# Patient Record
Sex: Female | Born: 1995 | Race: Black or African American | Hispanic: No | Marital: Single | Smoking: Never smoker
Health system: Southern US, Community
[De-identification: ages and names within clinical notes are randomized; demographics above are authoritative.]

## PROBLEM LIST (undated history)

## (undated) ENCOUNTER — Inpatient Hospital Stay (HOSPITAL_COMMUNITY): Payer: Self-pay

## (undated) DIAGNOSIS — Z789 Other specified health status: Secondary | ICD-10-CM

## (undated) HISTORY — PX: NO PAST SURGERIES: SHX2092

---

## 2016-05-28 ENCOUNTER — Inpatient Hospital Stay (HOSPITAL_COMMUNITY)
Admission: AD | Admit: 2016-05-28 | Discharge: 2016-05-28 | Disposition: A | Discharge status: Home / Self Care | Payer: Self-pay | Source: Ambulatory Visit | Attending: Obstetrics & Gynecology | Admitting: Obstetrics & Gynecology

## 2016-05-28 ENCOUNTER — Encounter (HOSPITAL_COMMUNITY): Payer: Self-pay | Admitting: *Deleted

## 2016-05-28 ENCOUNTER — Inpatient Hospital Stay (HOSPITAL_COMMUNITY): Payer: Self-pay

## 2016-05-28 DIAGNOSIS — O26899 Other specified pregnancy related conditions, unspecified trimester: Secondary | ICD-10-CM

## 2016-05-28 DIAGNOSIS — O26891 Other specified pregnancy related conditions, first trimester: Secondary | ICD-10-CM | POA: Insufficient documentation

## 2016-05-28 DIAGNOSIS — Z3A01 Less than 8 weeks gestation of pregnancy: Secondary | ICD-10-CM | POA: Insufficient documentation

## 2016-05-28 DIAGNOSIS — R109 Unspecified abdominal pain: Secondary | ICD-10-CM | POA: Insufficient documentation

## 2016-05-28 DIAGNOSIS — O9989 Other specified diseases and conditions complicating pregnancy, childbirth and the puerperium: Secondary | ICD-10-CM

## 2016-05-28 HISTORY — DX: Other specified health status: Z78.9

## 2016-05-28 LAB — URINALYSIS, ROUTINE W REFLEX MICROSCOPIC
Bilirubin Urine: NEGATIVE
GLUCOSE, UA: NEGATIVE mg/dL
Hgb urine dipstick: NEGATIVE
LEUKOCYTES UA: NEGATIVE
NITRITE: NEGATIVE
PH: 6 (ref 5.0–8.0)
Protein, ur: NEGATIVE mg/dL
Specific Gravity, Urine: 1.025 (ref 1.005–1.030)

## 2016-05-28 LAB — HCG, QUANTITATIVE, PREGNANCY: hCG, Beta Chain, Quant, S: 73580 m[IU]/mL — ABNORMAL HIGH (ref <5)

## 2016-05-28 LAB — POCT PREGNANCY, URINE: PREG TEST UR: POSITIVE — AB

## 2016-05-28 NOTE — MAU Provider Note (Signed)
History     CSN: [id number]  Arrival date and time: 05/28/16 1605   First Provider Initiated Contact with Patient 05/28/16 1707      Chief Complaint  Patient presents with  . Abdominal Pain   Laura Moyer is a 20 y.o. G1P0 at [redacted]w[redacted]d by LMP who presents to MAU today for evaluation of abdominal/pelvic pain in early pregnancy. Pain started two days ago, is constant, has gotten progressively worse (see details below). Denies any vaginal bleeding, abnormal vaginal discharge, fevers, chills, sweats, dysuria, nausea, vomiting, other GI or GU symptoms or other general symptoms.    Pelvic Pain  The patient's primary symptoms include pelvic pain. This is a new problem. The current episode started yesterday. The problem occurs constantly. The problem has been gradually worsening. The pain is moderate. The problem affects both (But left>right) sides. She is pregnant. Associated symptoms include abdominal pain. Pertinent negatives include no chills, constipation, diarrhea, dysuria, fever, flank pain, frequency, hematuria, nausea, urgency or vomiting. The symptoms are aggravated by activity. She has tried nothing for the symptoms. She is sexually active. No, her partner does not have an STD. She uses nothing for contraception.   Obstetric History   G1   P0   T0   P0   A0   L0    SAB0   TAB0   Ectopic0   Multiple0   Live Births0     # Outcome Date GA Lbr Len/2nd Weight Sex Delivery Anes PTL Lv  1 Current               Past Medical History:  Diagnosis Date  . Medical history non-contributory     Past Surgical History:  Procedure Laterality Date  . NO PAST SURGERIES     No family history on file.  Social History  Substance Use Topics  . Smoking status: Never Smoker  . Smokeless tobacco: Never Used  . Alcohol use No    Allergies: No Known Allergies  Prescriptions Prior to Admission  Medication Sig Dispense Refill Last Dose  . Prenatal Vit-Fe Fumarate-FA (PRENATAL MULTIVITAMIN) TABS  tablet Take 1 tablet by mouth daily at 12 noon.   05/27/2016 at Unknown time    Review of Systems  Constitutional: Negative for chills, fever, malaise/fatigue and weight loss.  Gastrointestinal: Positive for abdominal pain. Negative for blood in stool, constipation, diarrhea, nausea and vomiting.  Genitourinary: Positive for pelvic pain. Negative for dysuria, flank pain, frequency, hematuria and urgency.       No vaginal bleeding  Musculoskeletal: Negative for myalgias.   Physical Exam   Blood pressure 115/66, pulse 86, temperature 98.2 F (36.8 C), temperature source Oral, resp. rate 16, height 5\' 2"  (1.575 m), weight 121 lb (54.9 kg), last menstrual period 04/11/2016.  Physical Exam  Constitutional: She is oriented to person, place, and time. She appears well-developed and well-nourished.  HENT:  Head: Normocephalic and atraumatic.  Eyes: EOM are normal. Pupils are equal, round, and reactive to light.  Neck: Normal range of motion. Neck supple.  Cardiovascular: Normal rate.   Respiratory: Effort normal and breath sounds normal.  GI: Soft. She exhibits no distension and no mass. There is tenderness. There is no rebound and no guarding.  Mild diffuse lower abdominal tenderness to palpation, LLQ>RLQ  Genitourinary:  Genitourinary Comments: Deferred  Musculoskeletal: Normal range of motion.  Neurological: She is alert and oriented to person, place, and time.  Skin: Skin is warm and dry.  Psychiatric: She has a normal mood and  affect. Her behavior is normal.    MAU Course  Procedures  MDM Labs and Pelvic ultrasound ordered  Results for orders placed or performed during the hospital encounter of 05/28/16 (from the past 24 hour(s))  Urinalysis, Routine w reflex microscopic (not at [facility])     Status: Abnormal   Collection Time: 05/28/16  4:25 PM  Result Value Ref Range   Color, Urine YELLOW YELLOW   APPearance HAZY (A) CLEAR   Specific Gravity, Urine 1.025 1.005 - 1.030   pH 6.0  5.0 - 8.0   Glucose, UA NEGATIVE NEGATIVE mg/dL   Hgb urine dipstick NEGATIVE NEGATIVE   Bilirubin Urine NEGATIVE NEGATIVE   Ketones, ur >80 (A) NEGATIVE mg/dL   Protein, ur NEGATIVE NEGATIVE mg/dL   Nitrite NEGATIVE NEGATIVE   Leukocytes, UA NEGATIVE NEGATIVE  Pregnancy, urine POC     Status: Abnormal   Collection Time: 05/28/16  4:34 PM  Result Value Ref Range   Preg Test, Ur POSITIVE (A) NEGATIVE  hCG, quantitative, pregnancy     Status: Abnormal   Collection Time: 05/28/16  5:06 PM  Result Value Ref Range   hCG, Beta Chain, Quant, S 73,580 (H) <5 mIU/mL   US Ob Comp Less 14 Wks  Result Date: 05/28/2016 CLINICAL DATA:  Subacute onset of pelvic cramping. Initial encounter. EXAM: OBSTETRIC <14 WK [place] AND TRANSVAGINAL OB US TECHNIQUE: Both transabdominal and transvaginal ultrasound examinations were performed for complete evaluation of the gestation as well as the maternal uterus, adnexal regions, and pelvic cul-de-sac. Transvaginal technique was performed to assess early pregnancy. COMPARISON:  None. FINDINGS: Intrauterine gestational sac: Single; visualized and normal in shape. Yolk sac:  Yes Embryo:  Yes Cardiac Activity: Yes Heart Rate: 101  bpm CRL:  4  mm   6 w   0 d                  [place] EDC: 01/21/2017 Subchorionic hemorrhage: A trace amount of subchorionic hemorrhage is noted inferior to the gestational sac. Maternal uterus/adnexae: The uterus is otherwise unremarkable. The ovaries are within normal limits. The right ovary measures 2.5 x 1.6 x 1.6 cm, while the left ovary measures 3.2 x 2.3 x 2.3 cm. No suspicious adnexal masses are seen; there is no evidence for ovarian torsion. No free fluid is seen within the pelvic cul-de-sac. IMPRESSION: 1. Single live intrauterine pregnancy noted, with a crown-rump length of 4 mm, corresponding to a gestational age of [redacted] weeks 0 days. This matches the gestational age of [redacted] weeks 5 days by LMP, reflecting an estimated date of delivery of Jan 16, 2017. 2.  Trace subchorionic hemorrhage noted. Electronically Signed   By: Roanna Raider M.D.   On: 05/28/2016 19:58   US Ob Transvaginal  Result Date: 05/28/2016 CLINICAL DATA:  Subacute onset of pelvic cramping. Initial encounter. EXAM: OBSTETRIC <14 WK [place] AND TRANSVAGINAL OB US TECHNIQUE: Both transabdominal and transvaginal ultrasound examinations were performed for complete evaluation of the gestation as well as the maternal uterus, adnexal regions, and pelvic cul-de-sac. Transvaginal technique was performed to assess early pregnancy. COMPARISON:  None. FINDINGS: Intrauterine gestational sac: Single; visualized and normal in shape. Yolk sac:  Yes Embryo:  Yes Cardiac Activity: Yes Heart Rate: 101  bpm CRL:  4  mm   6 w   0 d                  [place] EDC: 01/21/2017 Subchorionic hemorrhage: A trace amount of subchorionic hemorrhage is  noted inferior to the gestational sac. Maternal uterus/adnexae: The uterus is otherwise unremarkable. The ovaries are within normal limits. The right ovary measures 2.5 x 1.6 x 1.6 cm, while the left ovary measures 3.2 x 2.3 x 2.3 cm. No suspicious adnexal masses are seen; there is no evidence for ovarian torsion. No free fluid is seen within the pelvic cul-de-sac. IMPRESSION: 1. Single live intrauterine pregnancy noted, with a crown-rump length of 4 mm, corresponding to a gestational age of [redacted] weeks 0 days. This matches the gestational age of [redacted] weeks 5 days by LMP, reflecting an estimated date of delivery of Jan 16, 2017. 2. Trace subchorionic hemorrhage noted. Electronically Signed   By: Roanna RaiderJeffery  Chang M.D.   On: 05/28/2016 19:58    Assessment and Plan  Pain in early pregnancy: Ultrasound results showing early viable IUP discussed with patient.  Patient advised to start prenatal care soon, will continue prenatal vitamins.  Pregnancy verification letter given to patient.  List of OB providers given to patient.  Bleeding and pain precautions reviewed. Advised to return for worsening  symptoms. Discharged to home in stable condition   Daimon Kean A, MD 05/28/2016, 6:56 PM

## 2016-05-28 NOTE — MAU Note (Signed)
Pt C/O lower abd cramping for the last 3 weeks, pos HPT last Thursday. [facility]  Denies bleeding.

## 2016-05-28 NOTE — Discharge Instructions (Signed)
[facility]    [facility]       Phone: [phone]  [facility] [place] Phone: [phone]  [facility] [place]  Phone: [phone]  [facility] [place]  Phone: [phone]  [facility] [place]  Phone: [phone]  [facility]       Phone: [phone]  [facility]    Phone: [phone]   [facility]    Phone: [phone]  [facility]    Phone: [phone]  [facility]    Phone: [phone]  [facility]    Phone: [phone]  [facility]       Phone: [phone]   [facility]  Phone: [phone]  [facility]    Phone: [phone]  [facility]   Phone: [phone]  [facility]      Phone: [phone]  [facility]    Phone: [phone]     Abdominal Pain During Pregnancy Abdominal pain is common in pregnancy. Most of the time, it does not cause harm. There are many causes of abdominal pain. Some causes are more serious than others. Some of the causes of abdominal pain in pregnancy are easily diagnosed. Occasionally, the diagnosis takes time to understand. Other times, the cause is not determined. Abdominal pain can be a sign that something is very wrong with the pregnancy, or the pain may have nothing to do with the pregnancy at all. For this reason, always tell your health care provider if you have any abdominal discomfort. HOME CARE INSTRUCTIONS  Monitor your abdominal pain for any changes. The following actions may help to alleviate any discomfort you are experiencing:  Do not have sexual intercourse or put anything in your vagina  until your symptoms go away completely.  Get plenty of rest until your pain improves.  Drink clear fluids if you feel nauseous. Avoid solid food as long as you are uncomfortable or nauseous.  Only take over-the-counter or prescription medicine as directed by your health care provider.  Keep all follow-up appointments with your health care provider. SEEK IMMEDIATE MEDICAL CARE IF:  You are bleeding, leaking fluid, or passing tissue from the vagina.  You have increasing pain or cramping.  You have persistent vomiting.  You have painful or bloody urination.  You have a fever.  You notice a decrease in your baby's movements.  You have extreme weakness or feel faint.  You have shortness of breath, with or without abdominal pain.  You develop a severe headache with abdominal pain.  You have abnormal vaginal discharge with abdominal pain.  You have persistent diarrhea.  You have abdominal pain that continues even after rest, or gets worse. MAKE SURE YOU:   Understand these instructions.  Will watch your condition.  Will get help right away if you are not doing well or get worse.   This information is not intended to replace advice given to you by your health care provider. Make sure you discuss any questions you have with your health care provider.   Document Released: 08/27/2005 Document Revised: 06/17/2013 Document Reviewed: 03/26/2013 Elsevier Interactive Patient Education [employer]2016 Elsevier Inc.

## 2016-10-25 ENCOUNTER — Emergency Department (HOSPITAL_COMMUNITY)
Admission: EM | Admit: 2016-10-25 | Discharge: 2016-10-25 | Disposition: A | Discharge status: Home / Self Care | Payer: Self-pay | Attending: Emergency Medicine | Admitting: Emergency Medicine

## 2016-10-25 ENCOUNTER — Encounter (HOSPITAL_COMMUNITY): Payer: Self-pay | Admitting: Emergency Medicine

## 2016-10-25 DIAGNOSIS — Z79899 Other long term (current) drug therapy: Secondary | ICD-10-CM | POA: Insufficient documentation

## 2016-10-25 DIAGNOSIS — Z3490 Encounter for supervision of normal pregnancy, unspecified, unspecified trimester: Secondary | ICD-10-CM

## 2016-10-25 DIAGNOSIS — Z3A00 Weeks of gestation of pregnancy not specified: Secondary | ICD-10-CM | POA: Insufficient documentation

## 2016-10-25 DIAGNOSIS — B349 Viral infection, unspecified: Secondary | ICD-10-CM

## 2016-10-25 DIAGNOSIS — O98519 Other viral diseases complicating pregnancy, unspecified trimester: Secondary | ICD-10-CM | POA: Insufficient documentation

## 2016-10-25 LAB — PREGNANCY, URINE: Preg Test, Ur: POSITIVE — AB

## 2016-10-25 MED ORDER — ACETAMINOPHEN 500 MG PO TABS
1000.0000 mg | ORAL_TABLET | Freq: Once | ORAL | Status: AC
  Administered 2016-10-25: 1000 mg via ORAL
  Filled 2016-10-25: qty 2

## 2016-10-25 MED ORDER — ONDANSETRON 4 MG PO TBDP
8.0000 mg | ORAL_TABLET | Freq: Once | ORAL | Status: AC
  Administered 2016-10-25: 8 mg via ORAL
  Filled 2016-10-25: qty 2

## 2016-10-25 MED ORDER — ONDANSETRON 4 MG PO TBDP: 4.0000 mg | ORAL_TABLET | Freq: Three times a day (TID) | ORAL | 0 refills | Status: DC | PRN

## 2016-10-25 NOTE — ED Triage Notes (Signed)
Pt from home with c/o hot and cold chills, body aches, N/V, and decreased appetite x 1 week.  Pt additionally reports a recent positive pregnancy test with scheduled PCP appointment.  NAD, A&O.

## 2016-10-25 NOTE — ED Notes (Signed)
Ginger ale given to  drink.   

## 2016-10-25 NOTE — ED Notes (Signed)
Patient c/o generalized bodyaches for several days states she had a positive home preg on Monday. C/o fever and chills not sleeping and vomiting.

## 2016-10-25 NOTE — Discharge Instructions (Signed)
I recommend taking Tylenol as prescribed over-the-counter as needed for fever/pain relief. Take your medication as prescribed as needed for nausea. Continue drinking fluids at home to remain hydrated. Follow-up with your OB/GYN at her scheduled appointment on Monday for follow-up evaluation and further management during her pregnancy. Please return to the Emergency Department if symptoms worsen or new onset of fever, chest pain, difficulty breathing, abdominal pain, vomiting, unable to keep fluids down, vaginal bleeding, vaginal discharge.

## 2016-10-25 NOTE — ED Notes (Signed)
Pt states tolerating fluids well. States she understands instructions. Home stable with steady gait.

## 2016-10-25 NOTE — ED Provider Notes (Signed)
[facility]EM[facility]GENCY DEPT Provider Note   CSN: [id number] Arrival date & time: 10/25/16  [id number]  By signing my name below, I, Sonum Patel, attest that this documentation has been prepared under the direction and in the presence of Melburn HakeNicole Amiylah Anastos, [place]. Electronically Signed: Sonum Patel, Neurosurgeoncribe. 10/25/16. 10:47 AM.  History   Chief Complaint Chief Complaint  Patient presents with  . Flu Like Symptoms    The history is provided by the patient. No language interpreter was used.    HPI Comments: Laura Moyer is a 21 y.o. female who presents to the Emergency Department complaining of a mild persistent HA with associated chills, generalized myalgias, nasal congestion, sinus pressure, rhinorrhea, ear pain, cough that began 1 week ago. She has tried Mucinex and  Alka-Seltzer without relief; she denies trying Tylenol or ibuprofen. She notes having a positive home pregnancy test 1.5 weeks ago and has had persistent nausea and vomiting daily since then. She denies visual changes, neck stiffness, sore throat, hemoptysis, CP, SOB, abdominal pain, diarrhea, urinary symptoms, vaginal bleeding/discharge, wheezing, rash. Denies any known sick contacts. Patient reports she has an appointment scheduled with her OB/GYN on 10/29/16.   Past Medical History:  Diagnosis Date  . Medical history non-contributory     There are no active problems to display for this patient.   Past Surgical History:  Procedure Laterality Date  . NO PAST SURGERIES      OB History    Gravida Para Term Preterm AB Living   1             SAB TAB Ectopic Multiple Live Births                   Home Medications    Prior to Admission medications   Medication Sig Start Date End Date Taking? Authorizing Provider  ondansetron (ZOFRAN ODT) 4 MG disintegrating tablet Take 1 tablet (4 mg total) by mouth every 8 (eight) hours as needed for nausea or vomiting. 10/25/16   Barrett HenleNicole Elizabeth Deniro Laymon, PA-C  Prenatal Vit-Fe Fumarate-FA  (PRENATAL MULTIVITAMIN) TABS tablet Take 1 tablet by mouth daily at 12 noon.    Historical Provider, MD    Family History History reviewed. No pertinent family history.  Social History Social History  Substance Use Topics  . Smoking status: Never Smoker  . Smokeless tobacco: Never Used  . Alcohol use No     Allergies   Patient has no known allergies.   Review of Systems Review of Systems  Constitutional: Positive for chills.  HENT: Positive for congestion, ear pain, rhinorrhea and sinus pressure. Negative for sore throat.   Respiratory: Positive for cough. Negative for shortness of breath and wheezing.   Gastrointestinal: Negative for diarrhea.  Genitourinary: Negative for difficulty urinating, dysuria, frequency and hematuria.  Musculoskeletal: Positive for myalgias.  Skin: Negative for rash.  Neurological: Positive for headaches.     Physical Exam Updated Vital Signs BP 101/75 (BP Location: Left Arm)   Pulse 114   Temp 98.9 F (37.2 C) (Oral)   Resp 16   Ht 5\' 1"  (1.549 m)   Wt 54 kg   LMP 09/12/2016 (Exact Date)   SpO2 100%   BMI 22.48 kg/m   Physical Exam  Constitutional: She is oriented to person, place, and time. She appears well-developed and well-nourished.  HENT:  Head: Normocephalic and atraumatic.  Right Ear: Tympanic membrane normal.  Left Ear: Tympanic membrane normal.  Nose: Nose normal. Right sinus exhibits no maxillary sinus tenderness and no  frontal sinus tenderness. Left sinus exhibits no maxillary sinus tenderness and no frontal sinus tenderness.  Mouth/Throat: Uvula is midline, oropharynx is clear and moist and mucous membranes are normal. No oropharyngeal exudate, posterior oropharyngeal edema, posterior oropharyngeal erythema or tonsillar abscesses. No tonsillar exudate.  Eyes: Conjunctivae and EOM are normal. Pupils are equal, round, and reactive to light. Right eye exhibits no discharge. Left eye exhibits no discharge. No scleral icterus.    Neck: Normal range of motion. Neck supple.  Cardiovascular: Normal rate, regular rhythm, normal heart sounds and intact distal pulses.   HR 92  Pulmonary/Chest: Effort normal and breath sounds normal. No respiratory distress. She has no wheezes. She has no rales. She exhibits no tenderness.  Abdominal: Soft. She exhibits no distension and no mass. There is no tenderness. There is no rebound and no guarding. No hernia.  Musculoskeletal: Normal range of motion.  Lymphadenopathy:    She has no cervical adenopathy.  Neurological: She is alert and oriented to person, place, and time.  Skin: Skin is warm and dry.  Psychiatric: She has a normal mood and affect.  Nursing note and vitals reviewed.    ED Treatments / Results  DIAGNOSTIC STUDIES: Oxygen Saturation is 99% on RA, normal by my interpretation.    COORDINATION OF CARE: 10:39 AM Discussed treatment plan with pt at bedside and pt agreed to plan.   Labs (all labs ordered are listed, but only abnormal results are displayed) Labs Reviewed  PREGNANCY, URINE - Abnormal; Notable for the following:       Result Value   Preg Test, Ur POSITIVE (*)    All other components within normal limits    EKG  EKG Interpretation None       Radiology No results found.  Procedures Procedures (including critical care time)  Medications Ordered in ED Medications  acetaminophen (TYLENOL) tablet 1,000 mg (1,000 mg Oral Given 10/25/16 1100)  ondansetron (ZOFRAN-ODT) disintegrating tablet 8 mg (8 mg Oral Given 10/25/16 1100)     Initial Impression / Assessment and Plan / ED Course  I have reviewed the triage vital signs and the nursing notes.  Pertinent labs & imaging results that were available during my care of the patient were reviewed by me and considered in my medical decision making (see chart for details).     Patient with symptoms consistent with influenza vs viral illness.  Vitals are stable, afebrile.  No signs of dehydration,  tolerating PO's s/p ODT zofran.  Lungs are clear. Due to patient's presentation and physical exam a chest x-ray was not ordered bc likely diagnosis of flu. Urine pregnancy positive in the ED. Discussed the cost versus benefit of Tamiflu treatment with the patient.  The patient understands that symptoms are greater than the recommended 24-48 hour window of treatment.  Patient will be discharged with instructions to orally hydrate, rest, and use over-the-counter medications such as Tylenol for fever.  Patient will also be given zofran for nausea. Reevaluation patient has remained hemodynamically stable and afebrile in the ED, denies abdominal pain. Plan to discharge patient home. Advised her to follow up with her OB/GYN at her scheduled appointment on 2/19. Discussed return precautions.    Final Clinical Impressions(s) / ED Diagnoses   Final diagnoses:  Viral illness  Pregnancy, unspecified gestational age    New Prescriptions Discharge Medication List as of 10/25/2016 11:37 AM    START taking these medications   Details  ondansetron (ZOFRAN ODT) 4 MG disintegrating tablet Take 1 tablet (  4 mg total) by mouth every 8 (eight) hours as needed for nausea or vomiting., Starting Thu 10/25/2016, Print       I personally performed the services described in this documentation, which was scribed in my presence. The recorded information has been reviewed and is accurate.    Satira Sark [place], [place] 10/25/16 1238    Mancel Bale, MD 10/25/16 [phone]

## 2017-04-02 ENCOUNTER — Encounter (HOSPITAL_COMMUNITY): Payer: Self-pay

## 2018-10-08 ENCOUNTER — Encounter (HOSPITAL_COMMUNITY): Payer: Self-pay | Admitting: Emergency Medicine

## 2018-10-08 ENCOUNTER — Other Ambulatory Visit: Payer: Self-pay

## 2018-10-08 ENCOUNTER — Emergency Department (HOSPITAL_COMMUNITY): Payer: Self-pay

## 2018-10-08 ENCOUNTER — Emergency Department (HOSPITAL_COMMUNITY)
Admission: EM | Admit: 2018-10-08 | Discharge: 2018-10-09 | Disposition: A | Discharge status: Home / Self Care | Payer: Self-pay | Attending: Emergency Medicine | Admitting: Emergency Medicine

## 2018-10-08 DIAGNOSIS — O26891 Other specified pregnancy related conditions, first trimester: Secondary | ICD-10-CM | POA: Insufficient documentation

## 2018-10-08 DIAGNOSIS — O219 Vomiting of pregnancy, unspecified: Secondary | ICD-10-CM | POA: Insufficient documentation

## 2018-10-08 DIAGNOSIS — Z3A00 Weeks of gestation of pregnancy not specified: Secondary | ICD-10-CM | POA: Insufficient documentation

## 2018-10-08 DIAGNOSIS — R109 Unspecified abdominal pain: Secondary | ICD-10-CM | POA: Insufficient documentation

## 2018-10-08 LAB — CBC
HCT: 43.3 % (ref 36.0–46.0)
Hemoglobin: 14.8 g/dL (ref 12.0–15.0)
MCH: 32.4 pg (ref 26.0–34.0)
MCHC: 34.2 g/dL (ref 30.0–36.0)
MCV: 94.7 fL (ref 80.0–100.0)
NRBC: 0 % (ref 0.0–0.2)
PLATELETS: 307 10*3/uL (ref 150–400)
RBC: 4.57 MIL/uL (ref 3.87–5.11)
RDW: 11.9 % (ref 11.5–15.5)
WBC: 9 10*3/uL (ref 4.0–10.5)

## 2018-10-08 LAB — COMPREHENSIVE METABOLIC PANEL
ALK PHOS: 44 U/L (ref 38–126)
ALT: 18 U/L (ref 0–44)
ANION GAP: 13 (ref 5–15)
AST: 21 U/L (ref 15–41)
Albumin: 4.8 g/dL (ref 3.5–5.0)
BILIRUBIN TOTAL: 0.9 mg/dL (ref 0.3–1.2)
BUN: 9 mg/dL (ref 6–20)
CALCIUM: 9.9 mg/dL (ref 8.9–10.3)
CO2: 27 mmol/L (ref 22–32)
CREATININE: 0.81 mg/dL (ref 0.44–1.00)
Chloride: 96 mmol/L — ABNORMAL LOW (ref 98–111)
GFR calc non Af Amer: >60 mL/min (ref >60)
Glucose, Bld: 125 mg/dL — ABNORMAL HIGH (ref 70–99)
Potassium: 3.6 mmol/L (ref 3.5–5.1)
SODIUM: 136 mmol/L (ref 135–145)
TOTAL PROTEIN: 9.6 g/dL — AB (ref 6.5–8.1)

## 2018-10-08 LAB — I-STAT BETA HCG BLOOD, ED (MC, WL, AP ONLY)

## 2018-10-08 LAB — HCG, QUANTITATIVE, PREGNANCY: hCG, Beta Chain, Quant, S: 31682 m[IU]/mL — ABNORMAL HIGH (ref <5)

## 2018-10-08 LAB — LIPASE, BLOOD: Lipase: 27 U/L (ref 11–51)

## 2018-10-08 MED ORDER — SODIUM CHLORIDE 0.9 % IV BOLUS
1000.0000 mL | Freq: Once | INTRAVENOUS | Status: AC
  Administered 2018-10-08: 1000 mL via INTRAVENOUS

## 2018-10-08 MED ORDER — SODIUM CHLORIDE 0.9% FLUSH: 3.0000 mL | Freq: Once | INTRAVENOUS | Status: DC

## 2018-10-08 MED ORDER — ONDANSETRON HCL 4 MG/2ML IJ SOLN
4.0000 mg | Freq: Once | INTRAMUSCULAR | Status: AC
  Administered 2018-10-08: 4 mg via INTRAVENOUS
  Filled 2018-10-08: qty 2

## 2018-10-08 NOTE — ED Provider Notes (Addendum)
[facility]   CSN: [id number] Arrival date & time: 10/08/18  1905     History   Chief Complaint Chief Complaint  Patient presents with  . Emesis  . Nausea    HPI Laura Moyer is a 23 y.o. female.  The history is provided by the patient and medical records. No language interpreter was used.  Emesis  Associated symptoms: abdominal pain and diarrhea    Laura Moyer is a 23 y.o. female  with no pertinent past medical history who presents to the Emergency Department complaining of neurolysed abdominal pain for 3 days.  Associated with nausea and vomiting.  She reports over 10 episodes of emesis, although lately has just been dry heaving.  She drank a bottle of mag citrate because of her generalized abdominal cramping.  Hours later, she developed diarrhea.  She reports at least 6 episodes of loose, nonbloody stool thus far today.  She denies any fever or chills.  No cough or congestion.  No vaginal discharge or bleeding.  No dysuria, urinary urgency or frequency.  No back pain or flank pain.  Last menstrual cycle at the beginning of this month.  Past Medical History:  Diagnosis Date  . Medical history non-contributory     There are no active problems to display for this patient.   Past Surgical History:  Procedure Laterality Date  . NO PAST SURGERIES       OB History    Gravida  1   Para      Term      Preterm      AB      Living        SAB      TAB      Ectopic      Multiple      Live Births               Home Medications    Prior to Admission medications   Medication Sig Start Date End Date Taking? Authorizing Provider  ondansetron (ZOFRAN ODT) 4 MG disintegrating tablet Take 1 tablet (4 mg total) by mouth every 8 (eight) hours as needed for nausea or vomiting. 10/25/16   Barrett HenleNadeau, Nicole Elizabeth, PA-C  Prenatal Vit-Fe Fumarate-FA (PRENATAL MULTIVITAMIN) TABS tablet Take 1 tablet by mouth  daily at 12 noon.    [provider]    Family History No family history on file.  Social History Social History   Tobacco Use  . Smoking status: Never Smoker  . Smokeless tobacco: Never Used  Substance Use Topics  . Alcohol use: No  . Drug use: No     Allergies   Patient has no known allergies.   Review of Systems Review of Systems  Gastrointestinal: Positive for abdominal pain, diarrhea, nausea and vomiting.  All other systems reviewed and are negative.    Physical Exam Updated Vital Signs BP 128/89   Pulse 86   Temp 98.6 F (37 C) (Oral)   Resp 17   LMP 09/17/2018   SpO2 96%   Physical Exam Vitals signs and nursing note reviewed.  Constitutional:      General: She is not in acute distress.    Appearance: She is well-developed.  HENT:     Head: Normocephalic and atraumatic.  Neck:     Musculoskeletal: Neck supple.  Cardiovascular:     Rate and Rhythm: Normal rate and regular rhythm.     Heart sounds: Normal heart sounds. No  murmur.  Pulmonary:     Effort: Pulmonary effort is normal. No respiratory distress.     Breath sounds: Normal breath sounds.  Abdominal:     General: There is no distension.     Palpations: Abdomen is soft.     Comments: Mild generalized abdominal tenderness without rebound or guarding.   Skin:    General: Skin is warm and dry.  Neurological:     Mental Status: She is alert and oriented to person, place, and time.      ED Treatments / Results  Labs (all labs ordered are listed, but only abnormal results are displayed) Labs Reviewed  COMPREHENSIVE METABOLIC PANEL - Abnormal; Notable for the following components:      Result Value   Chloride 96 (*)    Glucose, Bld 125 (*)    Total Protein 9.6 (*)    All other components within normal limits  HCG, QUANTITATIVE, PREGNANCY - Abnormal; Notable for the following components:   hCG, Beta Chain, Quant, S 90,240 (*)    All other components within normal limits  I-STAT  BETA HCG BLOOD, ED ([facility], [facility], [facility] ONLY) - Abnormal; Notable for the following components:   I-stat hCG, quantitative >2,000.0 (*)    All other components within normal limits  LIPASE, BLOOD  CBC  URINALYSIS, ROUTINE W REFLEX MICROSCOPIC    EKG None  Radiology US Ob Comp < 14 Wks  Result Date: 10/08/2018 CLINICAL DATA:  Generalized abdominal pain EXAM: OBSTETRIC <14 WK [place] AND TRANSVAGINAL OB US TECHNIQUE: Both transabdominal and transvaginal ultrasound examinations were performed for complete evaluation of the gestation as well as the maternal uterus, adnexal regions, and pelvic cul-de-sac. Transvaginal technique was performed to assess early pregnancy. COMPARISON:  None. FINDINGS: Intrauterine gestational sac: Single Yolk sac:  Visualized. Embryo:  Visualized. Cardiac Activity: Visualized. Heart Rate: 115 bpm CRL: 3 mm   5 w   6 d                  [place] EDC: 06/04/2019 Subchorionic hemorrhage:  None visualized. Maternal uterus/adnexae: Ovaries are within normal limits. The right ovary measures 2.1 x 1.6 x 2.1 cm. The left ovary measures 2.7 x 2 x 1.9 cm. No significant free fluid IMPRESSION: Single viable intrauterine pregnancy as above. No specific abnormality is seen Electronically Signed   By: Jasmine Pang M.D.   On: 10/08/2018 23:53   US Ob Transvaginal  Result Date: 10/08/2018 CLINICAL DATA:  Generalized abdominal pain EXAM: OBSTETRIC <14 WK [place] AND TRANSVAGINAL OB US TECHNIQUE: Both transabdominal and transvaginal ultrasound examinations were performed for complete evaluation of the gestation as well as the maternal uterus, adnexal regions, and pelvic cul-de-sac. Transvaginal technique was performed to assess early pregnancy. COMPARISON:  None. FINDINGS: Intrauterine gestational sac: Single Yolk sac:  Visualized. Embryo:  Visualized. Cardiac Activity: Visualized. Heart Rate: 115 bpm CRL: 3 mm   5 w   6 d                  [place] EDC: 06/04/2019 Subchorionic hemorrhage:  None visualized. Maternal  uterus/adnexae: Ovaries are within normal limits. The right ovary measures 2.1 x 1.6 x 2.1 cm. The left ovary measures 2.7 x 2 x 1.9 cm. No significant free fluid IMPRESSION: Single viable intrauterine pregnancy as above. No specific abnormality is seen Electronically Signed   By: Jasmine Pang M.D.   On: 10/08/2018 23:53    Procedures Procedures (including critical care time)  Medications Ordered in ED Medications  sodium chloride flush (NS) 0.9 % injection 3 mL (has no administration in time range)  sodium chloride 0.9 % bolus 1,000 mL (0 mLs Intravenous Stopped 10/08/18 2243)  ondansetron (ZOFRAN) injection 4 mg (4 mg Intravenous Given 10/08/18 2141)     Initial Impression / Assessment and Plan / ED Course  I have reviewed the triage vital signs and the nursing notes.  Pertinent labs & imaging results that were available during my care of the patient were reviewed by me and considered in my medical decision making (see chart for details).    Laura Moyer is a 23 y.o. female who presents to ED for generalized abdominal pain, n/v. hcg 95,62131,682. Remainder of labs reassuring. U/s showing single IUP without abnormalities. Feels much improved after fluids, anti-emetics. Tolerating PO.  I discussed return precautions, OBGYN follow up, starting prenatal and home care instructions with her. She understands and agrees with plan. All questions answered.   Final Clinical Impressions(s) / ED Diagnoses   Final diagnoses:  Abdominal pain during pregnancy in first trimester    ED Discharge Orders    None       Ger Nicks, Chase PicketJaime Pilcher, PA-C 10/09/18 [id number]    Doug SouJacubowitz, Sam, MD 10/09/18 0100

## 2018-10-08 NOTE — ED Notes (Signed)
Patient transported to ultrasound.

## 2018-10-08 NOTE — ED Triage Notes (Signed)
Pt. Stated, Ive had N/V for 2 days . Ive tried to treat myself and Im sick sick. I ate at [employer] on Sunday and Lavenia Atlas been sick ever since.

## 2018-10-08 NOTE — ED Notes (Signed)
ED Provider at bedside. 

## 2018-10-09 LAB — URINALYSIS, ROUTINE W REFLEX MICROSCOPIC
Bilirubin Urine: NEGATIVE
Glucose, UA: NEGATIVE mg/dL
Hgb urine dipstick: NEGATIVE
Ketones, ur: 80 mg/dL — AB
Leukocytes, UA: NEGATIVE
NITRITE: NEGATIVE
PH: 6 (ref 5.0–8.0)
Protein, ur: 30 mg/dL — AB
Specific Gravity, Urine: 1.031 — ABNORMAL HIGH (ref 1.005–1.030)

## 2018-10-09 NOTE — Discharge Instructions (Addendum)
It was my pleasure taking care of you today!   Please call the OBGYN tomorrow morning to schedule a follow up appointment.   Start taking a prenatal vitamin daily.   For nausea, take vitamin B6 (75 mg) three times a day along with Unisom (doxylamine) 25 mg before bed time. Ask the pharmacist for help if you cannot find these medications. Don't let your stomach get empty - eat several small meals - this will help prevent the nausea. Avoid greasy, fatty or highly seasoned foods.   Return to ER if you are throwing up so much that you cannot keep fluids down, develop a fever, new or worsening symptoms develop or you have any additional concerns.

## 2020-03-24 IMAGING — US US OB COMP LESS 14 WK
1 series · 14 of 28 positions shown · non-contrast
Comparison: None.

CLINICAL DATA: Generalized abdominal pain

EXAM:
OBSTETRIC <14 WK US AND TRANSVAGINAL OB US
TECHNIQUE: Both transabdominal and transvaginal ultrasound examinations were
performed for complete evaluation of the gestation as well as the
maternal uterus, adnexal regions, and pelvic cul-de-sac.
Transvaginal technique was performed to assess early pregnancy.

[Series 1: us ob comp less 14 wk · 14 of 32 slices shown]
[im 2/32]
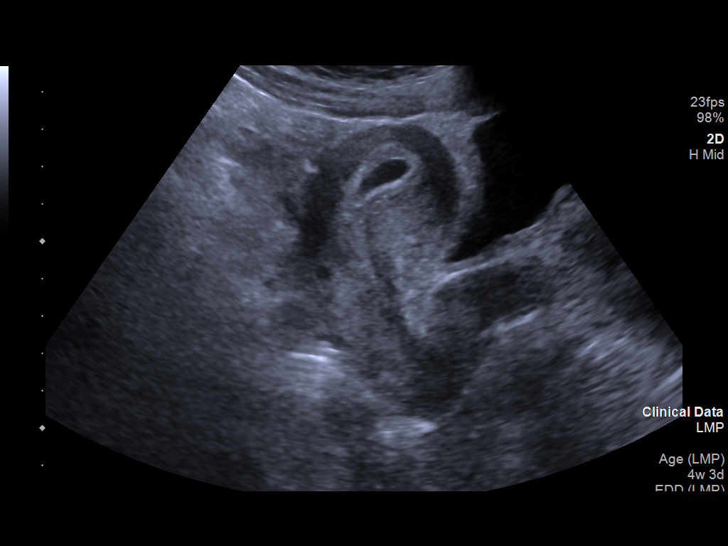
[im 4/32]
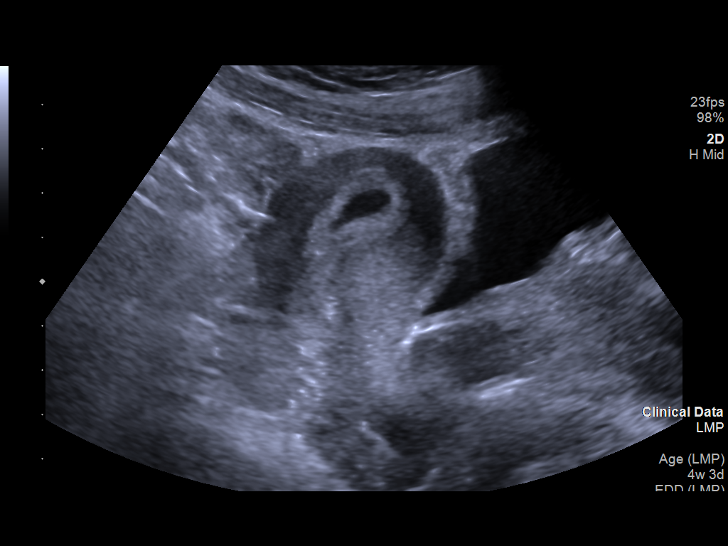
[im 6/32]
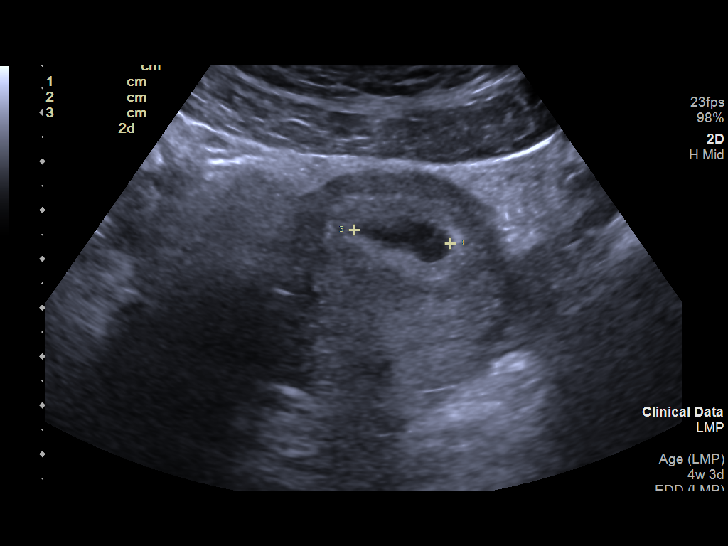
[im 9/32]
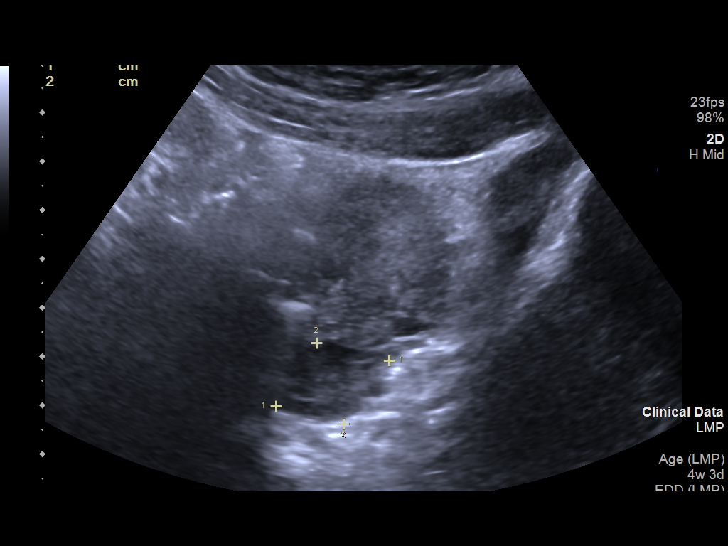
[im 11/32]
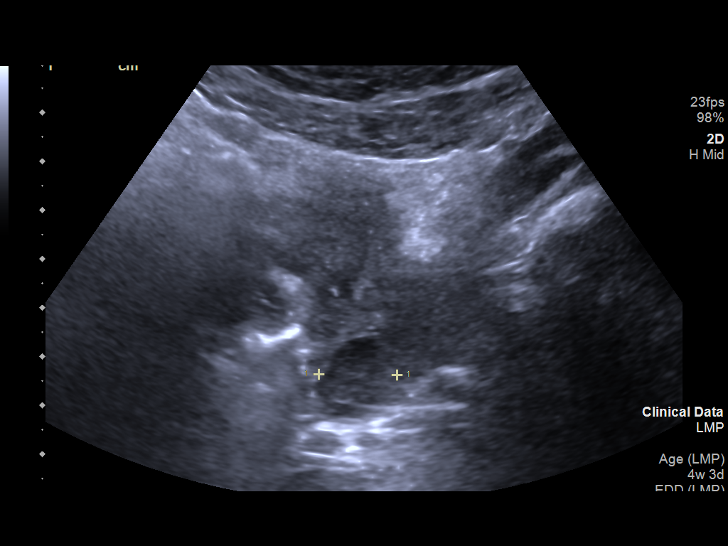
[im 13/32]
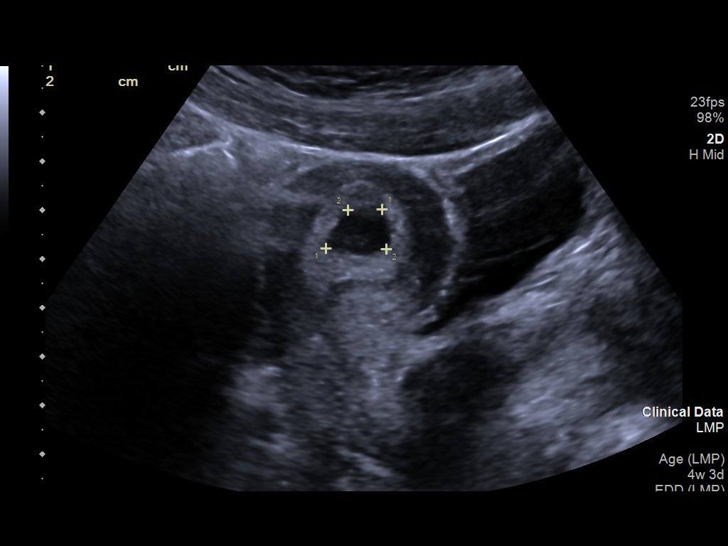
[im 15/32]
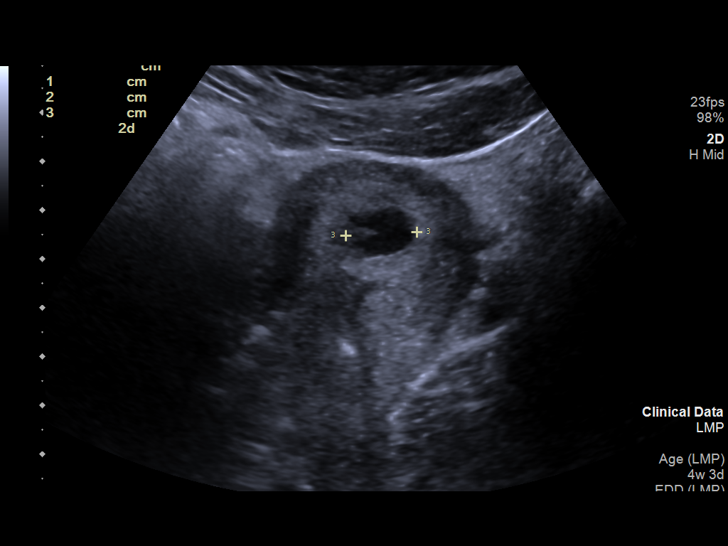
[im 18/32]
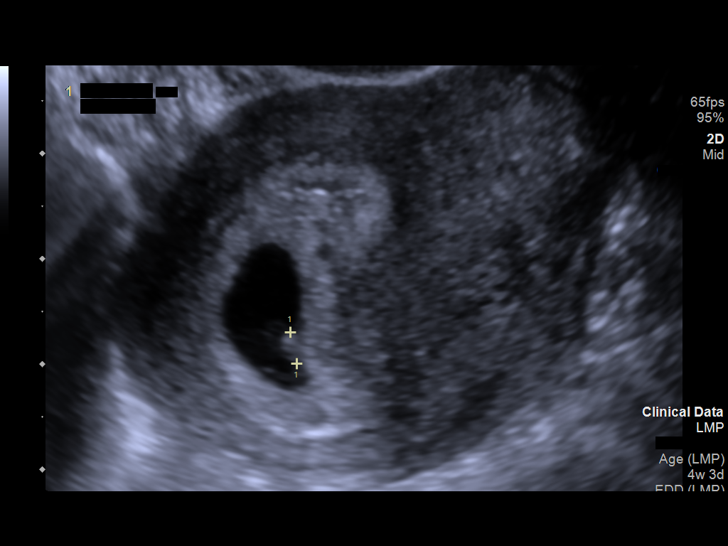
[im 20/32]
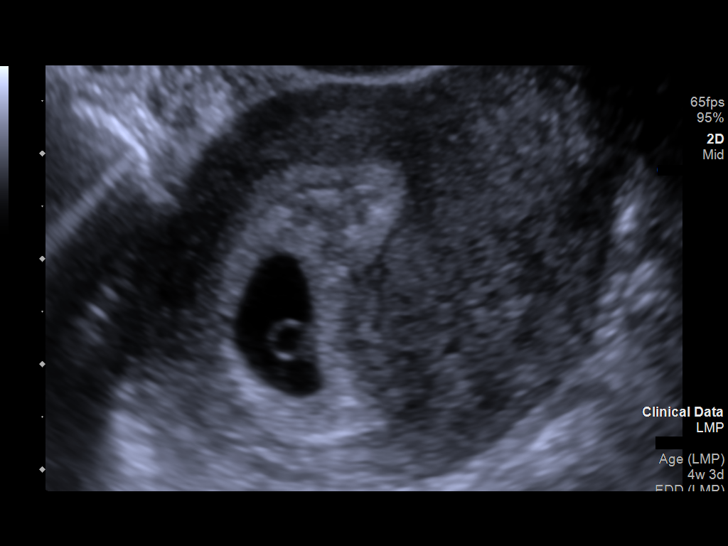
[im 22/32]
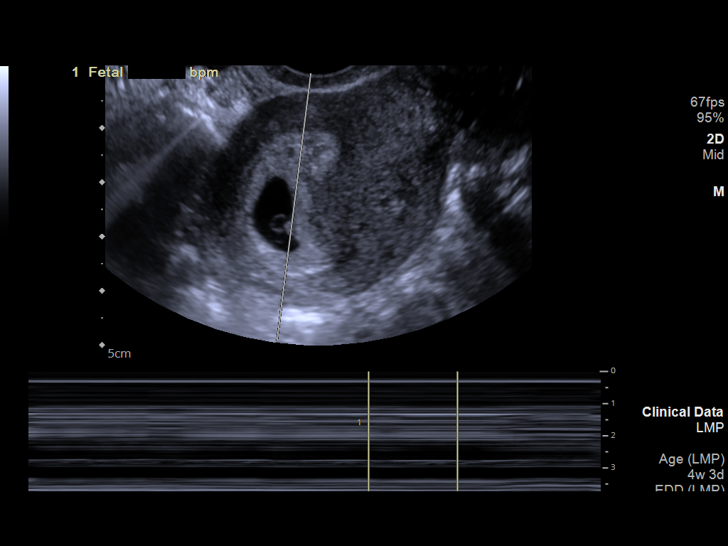
[im 25/32]
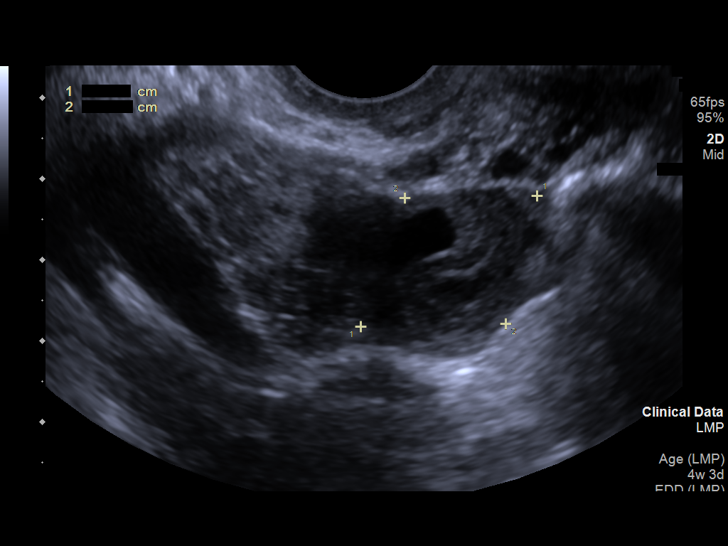
[im 27/32]
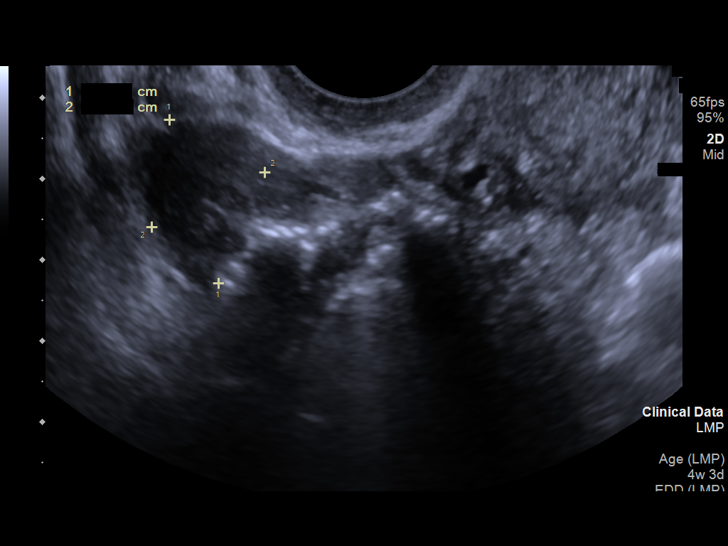
[im 29/32]
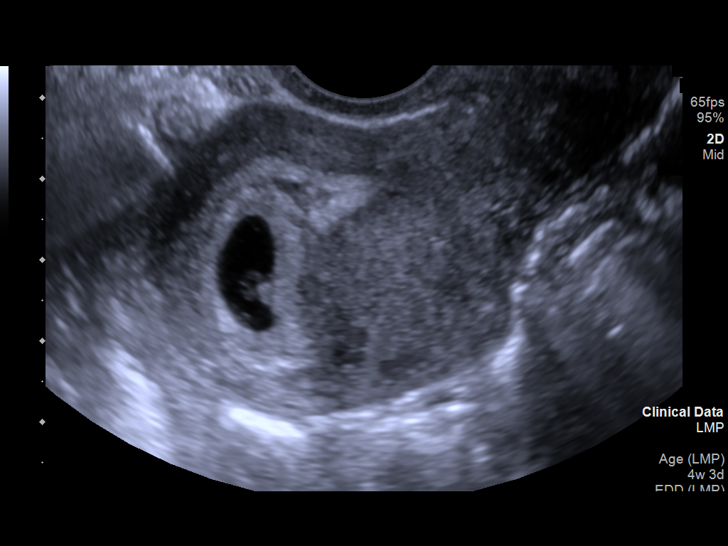
[im 32/32]
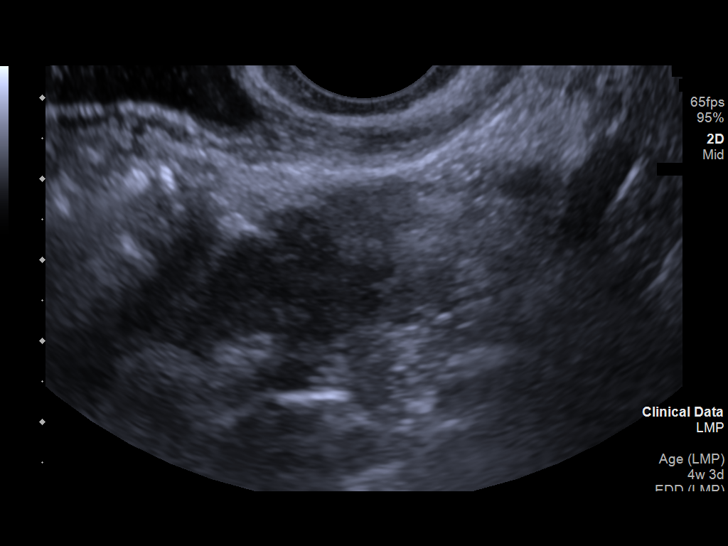

[14 of 28 positions shown; findings below may reference images not displayed]

FINDINGS: Intrauterine gestational sac: Single

Yolk sac:  Visualized.

Embryo:  Visualized.

Cardiac Activity: Visualized.

Heart Rate: 115 bpm

CRL: 3 mm   5 w   6 d                  US EDC: 06/04/2019

Subchorionic hemorrhage:  None visualized.

Maternal uterus/adnexae: Ovaries are within normal limits. The right
ovary measures 2.1 x 1.6 x 2.1 cm. The left ovary measures 2.7 x 2 x
1.9 cm. No significant free fluid
IMPRESSION: Single viable intrauterine pregnancy as above. No specific
abnormality is seen

## 2020-09-02 ENCOUNTER — Emergency Department (HOSPITAL_COMMUNITY)
Admission: EM | Admit: 2020-09-02 | Discharge: 2020-09-02 | Disposition: A | Discharge status: Home / Self Care | Payer: Self-pay | Attending: Emergency Medicine | Admitting: Emergency Medicine

## 2020-09-02 ENCOUNTER — Other Ambulatory Visit: Payer: Self-pay

## 2020-09-02 ENCOUNTER — Encounter (HOSPITAL_COMMUNITY): Payer: Self-pay | Admitting: Emergency Medicine

## 2020-09-02 DIAGNOSIS — Z3A01 Less than 8 weeks gestation of pregnancy: Secondary | ICD-10-CM | POA: Insufficient documentation

## 2020-09-02 DIAGNOSIS — R110 Nausea: Secondary | ICD-10-CM | POA: Insufficient documentation

## 2020-09-02 DIAGNOSIS — O99891 Other specified diseases and conditions complicating pregnancy: Secondary | ICD-10-CM | POA: Insufficient documentation

## 2020-09-02 DIAGNOSIS — R1084 Generalized abdominal pain: Secondary | ICD-10-CM | POA: Insufficient documentation

## 2020-09-02 DIAGNOSIS — Z3201 Encounter for pregnancy test, result positive: Secondary | ICD-10-CM | POA: Insufficient documentation

## 2020-09-02 DIAGNOSIS — O26891 Other specified pregnancy related conditions, first trimester: Secondary | ICD-10-CM | POA: Insufficient documentation

## 2020-09-02 LAB — POC URINE PREG, ED: Preg Test, Ur: POSITIVE — AB

## 2020-09-02 LAB — HCG, QUANTITATIVE, PREGNANCY: hCG, Beta Chain, Quant, S: 96442 m[IU]/mL — ABNORMAL HIGH (ref <5)

## 2020-09-02 MED ORDER — ONDANSETRON HCL 4 MG/2ML IJ SOLN
4.0000 mg | Freq: Once | INTRAMUSCULAR | Status: AC
  Administered 2020-09-02: 4 mg via INTRAVENOUS
  Filled 2020-09-02: qty 2

## 2020-09-02 MED ORDER — SODIUM CHLORIDE 0.9 % IV BOLUS
1000.0000 mL | Freq: Once | INTRAVENOUS | Status: AC
  Administered 2020-09-02: 1000 mL via INTRAVENOUS

## 2020-09-02 NOTE — ED Provider Notes (Signed)
[facility] EMERGENCY DEPARTMENT Provider Note   CSN: [id number] Arrival date & time: 09/02/20  1233     History Chief Complaint  Patient presents with  . Possible Pregnancy    Laura Moyer is a 24 y.o. female.  24 y.o female G2P0 presents to the ED with a chief complaint of possible pregnancy. Patient reports nausea which has been ongoing for the past 5 days. Her LMP was around 4 weeks ago, she has irregular periods therefore thought she likely was not pregnant. She reports multiple attempts to eat however has been unsuccessful in the past 3 days.  States that she is unable to keep anything down.  She does have a prior history of a miscarriage, but does endorse generalized abdominal aching as she states "I have not been able to eat anything ". Denies any vaginal bleeding, vaginal discharge, fever, urinary symptos or other complaints.   The history is provided by the patient.       Past Medical History:  Diagnosis Date  . Medical history non-contributory     There are no problems to display for this patient.   Past Surgical History:  Procedure Laterality Date  . NO PAST SURGERIES       OB History    Gravida  1   Para      Term      Preterm      AB      Living        SAB      IAB      Ectopic      Multiple      Live Births              History reviewed. No pertinent family history.  Social History   Tobacco Use  . Smoking status: Never Smoker  . Smokeless tobacco: Never Used  Substance Use Topics  . Alcohol use: No  . Drug use: No    Home Medications Prior to Admission medications   Medication Sig Start Date End Date Taking? Authorizing Provider  OVER THE COUNTER MEDICATION Take 1 capsule by mouth at bedtime as needed (cough). Vicks Severe Cold Nighttime   Yes [provider]  Phenylephrine-DM-GG-APAP (MUCINEX FAST-MAX SEVERE COLD PO) Take 15 mLs by mouth daily as needed (cough).   Yes [provider]  Prenatal Vit-Fe Fumarate-FA (PRENATAL MULTIVITAMIN) TABS tablet Take 1 tablet by mouth daily.   Yes [provider]    Allergies    Patient has no known allergies.  Review of Systems   Review of Systems  Constitutional: Negative for fever.  Respiratory: Negative for shortness of breath.   Gastrointestinal: Positive for abdominal pain and nausea. Negative for vomiting.    Physical Exam Updated Vital Signs BP 133/85   Pulse (!) 107   Temp 98.7 F (37.1 C) (Oral)   Resp 14   Ht 5\' 1"  (1.549 m)   Wt 45.8 kg   SpO2 100%   BMI 19.08 kg/m   Physical Exam Vitals and nursing note reviewed.  Constitutional:      General: She is not in acute distress.    Appearance: She is well-developed and well-nourished.  HENT:     Head: Normocephalic and atraumatic.     Mouth/Throat:     Mouth: Oropharynx is clear and moist.     Pharynx: No oropharyngeal exudate.  Eyes:     Pupils: Pupils are equal, round, and reactive to light.  Cardiovascular:  Rate and Rhythm: Regular rhythm.     Heart sounds: Normal heart sounds.  Pulmonary:     Effort: Pulmonary effort is normal. No respiratory distress.     Breath sounds: Normal breath sounds.  Abdominal:     General: Bowel sounds are normal. There is no distension.     Palpations: Abdomen is soft.     Tenderness: There is generalized abdominal tenderness. There is no right CVA tenderness or left CVA tenderness.  Musculoskeletal:        General: No tenderness or deformity.     Cervical back: Normal range of motion.     Right lower leg: No edema.     Left lower leg: No edema.  Skin:    General: Skin is warm and dry.  Neurological:     Mental Status: She is alert and oriented to person, place, and time.  Psychiatric:        Mood and Affect: Mood and affect normal.     ED Results / Procedures / Treatments   Labs (all labs ordered are listed, but only abnormal results are displayed) Labs Reviewed  POC URINE PREG, ED -  Abnormal; Notable for the following components:      Result Value   Preg Test, Ur POSITIVE (*)    All other components within normal limits  HCG, QUANTITATIVE, PREGNANCY    EKG None  Radiology No results found.  Procedures Procedures (including critical care time)  Medications Ordered in ED Medications  sodium chloride 0.9 % bolus 1,000 mL (1,000 mLs Intravenous New Bag/Given 09/02/20 1417)  ondansetron (ZOFRAN) injection 4 mg (4 mg Intravenous Given 09/02/20 1415)    ED Course  I have reviewed the triage vital signs and the nursing notes.  Pertinent labs & imaging results that were available during my care of the patient were reviewed by me and considered in my medical decision making (see chart for details).    MDM Rules/Calculators/A&P     Patient with no pertinent past medical history presents to the ED with a chief complaint of nausea for the past 5 days.  Reports the past 3 days she has been unable to keep anything down.  Her last menstrual period was around 4 weeks ago in the month of November, states her periods are somewhat irregular therefore she did not think she was pregnant.  Today she endorses multiple episodes of vomiting.  No prior history of hyperemesis gravidarum.  She has been pregnant in the past and suffer a miscarriage early on.  Has not taken any medication for improvement in her symptoms.  Denies any vaginal bleeding, vaginal discharge,no urinary symptoms.  During evaluation patient is overall nonill, nontoxic appearing.  Does appear in discomfort from nausea.  We discussed giving her fluids along with Zofran to help with her symptoms.  She is agreeable of therapy at this time.  Patient does not have a set OB/GYN, we did discuss the importance of following up with one after today's visit.  She is agreeable of this at this time.  Will also obtain an hCG quant.   Patient received fluids while in the ED, we discussed appropriate follow-up with OB/GYN. She was  also recommended to begin taking B6 along with Unisom combination to help with nausea in pregnancy. Vitals remained stable, she is hemodynamically stable. Patient is stable for discharge at this time.     Portions of this note were generated with Scientist, clinical (histocompatibility and immunogenetics). Dictation errors may occur despite best attempts at  proofreading.  Final Clinical Impression(s) / ED Diagnoses Final diagnoses:  Positive pregnancy test    Rx / DC Orders ED Discharge Orders    None       Claude Manges, Cordelia Poche 09/02/20 1523    Linwood Dibbles, MD 09/03/20 [id number]

## 2020-09-02 NOTE — Discharge Instructions (Addendum)
Your pregnancy test was positive on today's visit.   You will need to schedule an appointment with your OB/GYN.  There is a combination of medication that you can take for nausea:  -B6 vitamin  -Unisom (sleeping aid) 25 mg  Take these medications B6 in the morning and unisom at night, this should help improve your nausea. You will need to schedule an appointment with OBGYN to further manage your new pregnancy.

## 2020-09-02 NOTE — ED Triage Notes (Signed)
Pt arrives to ED for pregnancy test because she has missed her period x3 weeks and is now nauseous x2 days. Pt has not taken at home pregnancy test.

## 2020-09-02 NOTE — ED Notes (Addendum)
EKG done at 1259 hours 09/02/2020 in this pts chart was error. Disregard same.

## 2020-09-13 ENCOUNTER — Telehealth: Payer: Self-pay | Admitting: Family Medicine

## 2020-09-13 NOTE — Telephone Encounter (Signed)
Patient want to speak to someone said she cant keep anything down and she's throwing up brown stuff

## 2020-09-13 NOTE — Telephone Encounter (Signed)
I called  Laura Moyer back and informed her since she has not yet started care with [place] and has not been seen by one of our providers-even though she has an upcoming appointment- I cannot give her advice or send in any prescriptions. I advised her to go to [facility] for evaluation/ treatment if she is not able to keep fluids/ food down. She voices understanding. Bricen Victory,RN

## 2020-09-14 ENCOUNTER — Inpatient Hospital Stay (HOSPITAL_COMMUNITY)
Admission: AD | Admit: 2020-09-14 | Discharge: 2020-09-15 | Disposition: A | Discharge status: Home / Self Care | Payer: Self-pay | Attending: Obstetrics & Gynecology | Admitting: Obstetrics & Gynecology

## 2020-09-14 DIAGNOSIS — O210 Mild hyperemesis gravidarum: Secondary | ICD-10-CM | POA: Diagnosis present

## 2020-09-14 DIAGNOSIS — E860 Dehydration: Secondary | ICD-10-CM

## 2020-09-14 DIAGNOSIS — E876 Hypokalemia: Secondary | ICD-10-CM

## 2020-09-14 DIAGNOSIS — O219 Vomiting of pregnancy, unspecified: Secondary | ICD-10-CM

## 2020-09-14 DIAGNOSIS — O211 Hyperemesis gravidarum with metabolic disturbance: Secondary | ICD-10-CM | POA: Insufficient documentation

## 2020-09-14 DIAGNOSIS — Z3A13 13 weeks gestation of pregnancy: Secondary | ICD-10-CM | POA: Insufficient documentation

## 2020-09-15 ENCOUNTER — Other Ambulatory Visit: Payer: Self-pay

## 2020-09-15 ENCOUNTER — Encounter (HOSPITAL_COMMUNITY): Payer: Self-pay | Admitting: *Deleted

## 2020-09-15 DIAGNOSIS — O210 Mild hyperemesis gravidarum: Secondary | ICD-10-CM | POA: Diagnosis present

## 2020-09-15 DIAGNOSIS — O219 Vomiting of pregnancy, unspecified: Secondary | ICD-10-CM

## 2020-09-15 LAB — URINALYSIS, ROUTINE W REFLEX MICROSCOPIC
Bacteria, UA: NONE SEEN
Bilirubin Urine: NEGATIVE
Glucose, UA: NEGATIVE mg/dL
Hgb urine dipstick: NEGATIVE
Ketones, ur: 80 mg/dL — AB
Leukocytes,Ua: NEGATIVE
Nitrite: NEGATIVE
Protein, ur: 30 mg/dL — AB
Specific Gravity, Urine: 1.032 — ABNORMAL HIGH (ref 1.005–1.030)
pH: 6 (ref 5.0–8.0)

## 2020-09-15 LAB — CBC
HCT: 40.6 % (ref 36.0–46.0)
Hemoglobin: 15 g/dL (ref 12.0–15.0)
MCH: 33 pg (ref 26.0–34.0)
MCHC: 36.9 g/dL — ABNORMAL HIGH (ref 30.0–36.0)
MCV: 89.4 fL (ref 80.0–100.0)
Platelets: 352 10*3/uL (ref 150–400)
RBC: 4.54 MIL/uL (ref 3.87–5.11)
RDW: 11.2 % — ABNORMAL LOW (ref 11.5–15.5)
WBC: 9.1 10*3/uL (ref 4.0–10.5)
nRBC: 0 % (ref 0.0–0.2)

## 2020-09-15 LAB — BASIC METABOLIC PANEL
Anion gap: 17 — ABNORMAL HIGH (ref 5–15)
BUN: 18 mg/dL (ref 6–20)
CO2: 27 mmol/L (ref 22–32)
Calcium: 10.1 mg/dL (ref 8.9–10.3)
Chloride: 86 mmol/L — ABNORMAL LOW (ref 98–111)
Creatinine, Ser: 0.71 mg/dL (ref 0.44–1.00)
GFR, Estimated: >60 mL/min (ref >60)
Glucose, Bld: 111 mg/dL — ABNORMAL HIGH (ref 70–99)
Potassium: 2.8 mmol/L — ABNORMAL LOW (ref 3.5–5.1)
Sodium: 130 mmol/L — ABNORMAL LOW (ref 135–145)

## 2020-09-15 MED ORDER — POTASSIUM CHLORIDE ER 10 MEQ PO TBCR: 10.0000 meq | EXTENDED_RELEASE_TABLET | Freq: Every day | ORAL | 0 refills | Status: DC

## 2020-09-15 MED ORDER — PANTOPRAZOLE SODIUM 20 MG PO TBEC: 20.0000 mg | DELAYED_RELEASE_TABLET | Freq: Every day | ORAL | 1 refills | Status: DC

## 2020-09-15 MED ORDER — PYRIDOXINE HCL 100 MG/ML IJ SOLN
100.0000 mg | Freq: Once | INTRAMUSCULAR | Status: AC
  Administered 2020-09-15: 100 mg via INTRAVENOUS
  Filled 2020-09-15: qty 1

## 2020-09-15 MED ORDER — LACTATED RINGERS IV SOLN: Freq: Once | INTRAVENOUS | Status: AC

## 2020-09-15 MED ORDER — DEXAMETHASONE SODIUM PHOSPHATE 10 MG/ML IJ SOLN
10.0000 mg | Freq: Once | INTRAMUSCULAR | Status: AC
  Administered 2020-09-15: 10 mg via INTRAVENOUS
  Filled 2020-09-15: qty 1

## 2020-09-15 MED ORDER — LACTATED RINGERS IV SOLN: INTRAVENOUS | Status: DC

## 2020-09-15 MED ORDER — POTASSIUM CHLORIDE CRYS ER 20 MEQ PO TBCR
20.0000 meq | EXTENDED_RELEASE_TABLET | Freq: Once | ORAL | Status: AC
  Administered 2020-09-15: 20 meq via ORAL
  Filled 2020-09-15: qty 1

## 2020-09-15 MED ORDER — PROMETHAZINE HCL 25 MG/ML IJ SOLN
12.5000 mg | Freq: Once | INTRAMUSCULAR | Status: AC
  Administered 2020-09-15: 12.5 mg via INTRAVENOUS
  Filled 2020-09-15: qty 1

## 2020-09-15 MED ORDER — PROMETHAZINE HCL 25 MG PO TABS: 25.0000 mg | ORAL_TABLET | Freq: Four times a day (QID) | ORAL | 2 refills | Status: DC | PRN

## 2020-09-15 MED ORDER — ONDANSETRON 4 MG PO TBDP: 4.0000 mg | ORAL_TABLET | Freq: Four times a day (QID) | ORAL | 0 refills | Status: DC | PRN

## 2020-09-15 MED ORDER — FAMOTIDINE IN NACL 20-0.9 MG/50ML-% IV SOLN
20.0000 mg | Freq: Once | INTRAVENOUS | Status: AC
  Administered 2020-09-15: 20 mg via INTRAVENOUS
  Filled 2020-09-15: qty 50

## 2020-09-15 NOTE — MAU Note (Signed)
Pt has had n/v for 2 wks. Taking B6 and unisom but not helping. Denies VB or d/c. First appt at [facility] on 09/28/20

## 2020-09-15 NOTE — ED Triage Notes (Signed)
Emergency Medicine Provider OB Triage Evaluation Note  Laura Moyer is a 25 y.o. female, G1P0, at Unknown gestation who presents to the emergency department with complaints of nausea, vomiting, and abdominal pain pain for the past 2 weeks.  Patient states she is unable to keep anything down.  She denies fever, hematemesis, diarrhea, chest pain, or dyspnea.  Review of  Systems  Positive: Abdominal pain, nausea, and vomiting Negative: Fever, hematemesis, diarrhea, chest pain, or dyspnea  Physical Exam  BP 112/80 (BP Location: Left Arm)   Pulse (!) 109   Temp 98.5 F (36.9 C) (Oral)   Resp 18   Ht 5\' 1"  (1.549 m)   Wt 42.6 kg   LMP 06/15/2020   SpO2 98%   BMI 17.76 kg/m  General: Awake HEENT: Atraumatic  Resp: Normal effort  Cardiac: Mild tachycardia Abd: Nondistended, mild generalized tenderness MSK: Moves all extremities without difficulty Neuro: Speech clear  Medical Decision Making  Pt evaluated for pregnancy concern and is stable for transfer to [facility]. Pt is in agreement with plan for transfer.  1:04 AM Discussed with MAU APP, 08/15/2020, who accepts patient in transfer.  Clinical Impression   1. Nausea/vomiting in pregnancy        Hilda Lias, PA-C 09/15/20 0134

## 2020-09-15 NOTE — MAU Provider Note (Signed)
Chief Complaint: Emesis   Event Date/Time   First Provider Initiated Contact with Patient 09/15/20 0305        SUBJECTIVE HPI: Laura Moyer is a 25 y.o. G1P0 at [redacted]w[redacted]d by LMP who presents via [facility] ED to maternity admissions reporting 2 week history of nausea and vomiting with no food intake for a week.  Unisom and B6 are not helping. . She denies vaginal bleeding, vaginal itching/burning, urinary symptoms, h/a, dizziness, or fever/chills.    Weight in 2018  54kg Weight today is 42.6kg  Emesis  This is a recurrent problem. The current episode started 1 to 4 weeks ago. The problem has been unchanged. There has been no fever. Associated symptoms include abdominal pain (from vomiting) and weight loss. Pertinent negatives include no chills, coughing, diarrhea, dizziness, fever, headaches or myalgias. Treatments tried: Unisom and B6. The treatment provided no relief.    RN Note: Pt has had n/v for 2 wks. Taking B6 and unisom but not helping. Denies VB or d/c. First appt at [facility] on 09/28/20  Past Medical History:  Diagnosis Date  . Medical history non-contributory    Past Surgical History:  Procedure Laterality Date  . NO PAST SURGERIES     Social History   Socioeconomic History  . Marital status: Single    Spouse name: Not on file  . Number of children: Not on file  . Years of education: Not on file  . Highest education level: Not on file  Occupational History  . Not on file  Tobacco Use  . Smoking status: Never Smoker  . Smokeless tobacco: Never Used  Substance and Sexual Activity  . Alcohol use: No  . Drug use: No  . Sexual activity: Yes  Other Topics Concern  . Not on file  Social History Narrative  . Not on file   Social Determinants of Health   Financial Resource Strain: Not on file  Food Insecurity: Not on file  Transportation Needs: Not on file  Physical Activity: Not on file  Stress: Not on file  Social Connections: Not on file  Intimate Partner  Violence: Not on file   No current facility-administered medications on file prior to encounter.   Current Outpatient Medications on File Prior to Encounter  Medication Sig Dispense Refill  . doxylamine, Sleep, (UNISOM) 25 MG tablet Take 25 mg by mouth at bedtime as needed.    . Prenatal Vit-Fe Fumarate-FA (PRENATAL MULTIVITAMIN) TABS tablet Take 1 tablet by mouth daily.    Marland Kitchen pyridOXINE (VITAMIN B-6) 100 MG tablet Take 100 mg by mouth daily.    Marland Kitchen OVER THE COUNTER MEDICATION Take 1 capsule by mouth at bedtime as needed (cough). Vicks Severe Cold Nighttime    . Phenylephrine-DM-GG-APAP (MUCINEX FAST-MAX SEVERE COLD PO) Take 15 mLs by mouth daily as needed (cough).     No Known Allergies  I have reviewed patient's Past Medical Hx, Surgical Hx, Family Hx, Social Hx, medications and allergies.   ROS:  Review of Systems  Constitutional: Positive for weight loss. Negative for chills and fever.  Respiratory: Negative for cough.   Gastrointestinal: Positive for abdominal pain (from vomiting) and vomiting. Negative for diarrhea.  Musculoskeletal: Negative for myalgias.  Neurological: Negative for dizziness and headaches.   Review of Systems  Other systems negative   Physical Exam  Physical Exam Patient Vitals for the past 24 hrs:  BP Temp Temp src Pulse Resp SpO2 Height Weight  09/15/20 0215 118/83 98.9 F (37.2 C) - (!) 103  16 - - -  09/15/20 0052 - - - - - - 5\' 1"  (1.549 m) 42.6 kg  09/14/20 2325 112/80 98.5 F (36.9 C) Oral (!) 109 18 98 % - -   Constitutional: Well-developed, well-nourished female in no acute distress.  Cardiovascular: normal rate Respiratory: normal effort GI: Abd soft, diffusely mildly tender. Pos BS x 4 MS: Extremities nontender, no edema, normal ROM Neurologic: Alert and oriented x 4.  GU: Neg CVAT.  PELVIC EXAM: deferred  LAB RESULTS Results for orders placed or performed during the hospital encounter of 09/14/20 (from the past 24 hour(s))   Urinalysis, Routine w reflex microscopic Urine, Clean Catch     Status: Abnormal   Collection Time: 09/15/20  2:23 AM  Result Value Ref Range   Color, Urine AMBER (A) YELLOW   APPearance HAZY (A) CLEAR   Specific Gravity, Urine 1.032 (H) 1.005 - 1.030   pH 6.0 5.0 - 8.0   Glucose, UA NEGATIVE NEGATIVE mg/dL   Hgb urine dipstick NEGATIVE NEGATIVE   Bilirubin Urine NEGATIVE NEGATIVE   Ketones, ur 80 (A) NEGATIVE mg/dL   Protein, ur 30 (A) NEGATIVE mg/dL   Nitrite NEGATIVE NEGATIVE   Leukocytes,Ua NEGATIVE NEGATIVE   RBC / HPF 0-5 0 - 5 RBC/hpf   WBC, UA 0-5 0 - 5 WBC/hpf   Bacteria, UA NONE SEEN NONE SEEN   Squamous Epithelial / LPF 0-5 0 - 5   Mucus PRESENT   CBC     Status: Abnormal   Collection Time: 09/15/20  5:46 AM  Result Value Ref Range   WBC 9.1 4.0 - 10.5 K/uL   RBC 4.54 3.87 - 5.11 MIL/uL   Hemoglobin 15.0 12.0 - 15.0 g/dL   HCT 40.6 36.0 - 46.0 %   MCV 89.4 80.0 - 100.0 fL   MCH 33.0 26.0 - 34.0 pg   MCHC 36.9 (H) 30.0 - 36.0 g/dL   RDW 11.2 (L) 11.5 - 15.5 %   Platelets 352 150 - 400 K/uL   nRBC 0.0 0.0 - 0.2 %  Basic metabolic panel     Status: Abnormal   Collection Time: 09/15/20  5:46 AM  Result Value Ref Range   Sodium 130 (L) 135 - 145 mmol/L   Potassium 2.8 (L) 3.5 - 5.1 mmol/L   Chloride 86 (L) 98 - 111 mmol/L   CO2 27 22 - 32 mmol/L   Glucose, Bld 111 (H) 70 - 99 mg/dL   BUN 18 6 - 20 mg/dL   Creatinine, Ser 0.71 0.44 - 1.00 mg/dL   Calcium 10.1 8.9 - 10.3 mg/dL   GFR, Estimated >60 >60 mL/min   Anion gap 17 (H) 5 - 15     IMAGING Pt informed that the ultrasound is considered a limited OB ultrasound and is not intended to be a complete ultrasound exam.  Patient also informed that the ultrasound is not being completed with the intent of assessing for fetal or placental anomalies or any pelvic abnormalities.  Explained that the purpose of today's ultrasound is to assess for fetal viabilty.  Patient acknowledges the purpose of the exam and the  limitations of the study.    Single IUP with fetus measuring [redacted]w[redacted]d FHR 160  MAU Management/MDM: Ordered IV hydration and meds for nausea Gave Phenergan, Decadron, Pepcid and vitamin B6. Reviewed labs and noted hypokalemia Gave K-Dur after she had had meds and IV fluids.  She was able to tolerate it so will supplement at home   ASSESSMENT  1. Nausea/vomiting in pregnancy   2.     Dehydration 3.     Hypokalemia  PLAN Discharge home Will send Rx for Phenergan for nausea Rx Zofran for back up if Phenergan does not help Rx Protonix for acid reduction Continue B6 Advance diet as tolerated  Pt stable at time of discharge. Encouraged to return here if she develops worsening of symptoms, increase in pain, fever, or other concerning symptoms.    Wynelle Bourgeois CNM, MSN Certified Nurse-Midwife 09/15/2020  3:05 AM

## 2020-09-15 NOTE — Discharge Instructions (Signed)
Hyperemesis Gravidarum Hyperemesis gravidarum is a severe form of nausea and vomiting that happens during pregnancy. Hyperemesis is worse than morning sickness. It may cause you to have nausea or vomiting all day for many days. It may keep you from eating and drinking enough food and liquids, which can lead to dehydration, malnutrition, and weight loss. Hyperemesis usually occurs during the first half (the first 20 weeks) of pregnancy. It often goes away once a woman is in her second half of pregnancy. However, sometimes hyperemesis continues through an entire pregnancy. What are the causes? The cause of this condition is not known. It may be related to changes in chemicals (hormones) in the body during pregnancy, such as the high level of pregnancy hormone (human chorionic gonadotropin) or the increase in the female sex hormone (estrogen). What are the signs or symptoms? Symptoms of this condition include:  Nausea that does not go away.  Vomiting that does not allow you to keep any food down.  Weight loss.  Body fluid loss (dehydration).  Having no desire to eat, or not liking food that you have previously enjoyed. How is this diagnosed? This condition may be diagnosed based on:  A physical exam.  Your medical history.  Your symptoms.  Blood tests.  Urine tests. How is this treated? This condition is managed by controlling symptoms. This may include:  Following an eating plan. This can help lessen nausea and vomiting.  Taking prescription medicines. An eating plan and medicines are often used together to help control symptoms. If medicines do not help relieve nausea and vomiting, you may need to receive fluids through an IV at the hospital. Follow these instructions at home: Eating and drinking   Avoid the following: ? Drinking fluids with meals. Try not to drink anything during the 30 minutes before and after your meals. ? Drinking more than 1 cup of fluid at a  time. ? Eating foods that trigger your symptoms. These may include spicy foods, coffee, high-fat foods, very sweet foods, and acidic foods. ? Skipping meals. Nausea can be more intense on an empty stomach. If you cannot tolerate food, do not force it. Try sucking on ice chips or other frozen items and make up for missed calories later. ? Lying down within 2 hours after eating. ? Being exposed to environmental triggers. These may include food smells, smoky rooms, closed spaces, rooms with strong smells, warm or humid places, overly loud and noisy rooms, and rooms with motion or flickering lights. Try eating meals in a well-ventilated area that is free of strong smells. ? Quick and sudden changes in your movement. ? Taking iron pills and multivitamins that contain iron. If you take prescription iron pills, do not stop taking them unless your health care provider approves. ? Preparing food. The smell of food can spoil your appetite or trigger nausea.  To help relieve your symptoms: ? Listen to your body. Everyone is different and has different preferences. Find what works best for you. ? Eat and drink slowly. ? Eat 5-6 small meals daily instead of 3 large meals. Eating small meals and snacks can help you avoid an empty stomach. ? In the morning, before getting out of bed, eat a couple of crackers to avoid moving around on an empty stomach. ? Try eating starchy foods as these are usually tolerated well. Examples include cereal, toast, bread, potatoes, pasta, rice, and pretzels. ? Include at least 1 serving of protein with your meals and snacks. Protein options include   lean meats, poultry, seafood, beans, nuts, nut butters, eggs, cheese, and yogurt. ? Try eating a protein-rich snack before bed. Examples of a protein-rick snack include cheese and crackers or a peanut butter sandwich made with 1 slice of whole-wheat bread and 1 tsp (5 g) of peanut butter. ? Eat or suck on things that have ginger in them.  It may help relieve nausea. Add  tsp ground ginger to hot tea or choose ginger tea. ? Try drinking 100% fruit juice or an electrolyte drink. An electrolyte drink contains sodium, potassium, and chloride. ? Drink fluids that are cold, clear, and carbonated or sour. Examples include lemonade, ginger ale, lemon-lime soda, ice water, and sparkling water. ? Brush your teeth or use a mouth rinse after meals. ? Talk with your health care provider about starting a supplement of vitamin B6. General instructions  Take over-the-counter and prescription medicines only as told by your health care provider.  Follow instructions from your health care provider about eating or drinking restrictions.  Continue to take your prenatal vitamins as told by your health care provider. If you are having trouble taking your prenatal vitamins, talk with your health care provider about different options.  Keep all follow-up and pre-birth (prenatal) visits as told by your health care provider. This is important. Contact a health care provider if:  You have pain in your abdomen.  You have a severe headache.  You have vision problems.  You are losing weight.  You feel weak or dizzy. Get help right away if:  You cannot drink fluids without vomiting.  You vomit blood.  You have constant nausea and vomiting.  You are very weak.  You faint.  You have a fever and your symptoms suddenly get worse. Summary  Hyperemesis gravidarum is a severe form of nausea and vomiting that happens during pregnancy.  Making some changes to your eating habits may help relieve nausea and vomiting.  This condition may be managed with medicine.  If medicines do not help relieve nausea and vomiting, you may need to receive fluids through an IV at the hospital. This information is not intended to replace advice given to you by your health care provider. Make sure you discuss any questions you have with your health care  provider. Document Revised: 09/16/2017 Document Reviewed: 04/25/2016 Elsevier Patient Education  2020 Elsevier Inc.  

## 2020-09-15 NOTE — ED Triage Notes (Signed)
The pt is 6-[redacted] weeks pregnant she has been vomiting for 2 weeks  lmp 2 months ago  Unable to keep anything down.  She was seen last week here and was given iv fluid

## 2020-09-28 ENCOUNTER — Other Ambulatory Visit (HOSPITAL_COMMUNITY)
Admission: RE | Admit: 2020-09-28 | Discharge: 2020-09-28 | Disposition: A | Discharge status: Home / Self Care | Payer: Self-pay | Source: Ambulatory Visit | Attending: Advanced Practice Midwife | Admitting: Advanced Practice Midwife

## 2020-09-28 ENCOUNTER — Ambulatory Visit (INDEPENDENT_AMBULATORY_CARE_PROVIDER_SITE_OTHER): Payer: Self-pay | Admitting: Student

## 2020-09-28 ENCOUNTER — Other Ambulatory Visit: Payer: Self-pay

## 2020-09-28 ENCOUNTER — Encounter: Payer: Self-pay | Admitting: Student

## 2020-09-28 DIAGNOSIS — Z3491 Encounter for supervision of normal pregnancy, unspecified, first trimester: Secondary | ICD-10-CM | POA: Insufficient documentation

## 2020-09-28 MED ORDER — ONDANSETRON 4 MG PO TBDP: 4.0000 mg | ORAL_TABLET | Freq: Four times a day (QID) | ORAL | 1 refills | Status: DC | PRN

## 2020-09-28 MED ORDER — PROMETHAZINE HCL 25 MG PO TABS: 25.0000 mg | ORAL_TABLET | Freq: Four times a day (QID) | ORAL | 2 refills | Status: DC | PRN

## 2020-09-28 MED ORDER — GLYCOPYRROLATE 1 MG PO TABS: 1.0000 mg | ORAL_TABLET | Freq: Three times a day (TID) | ORAL | 1 refills | Status: DC

## 2020-09-28 NOTE — Progress Notes (Signed)
  Subjective:    Laura Moyer is being seen today for her first obstetrical visit.  This is not a planned pregnancy. She is at [redacted]w[redacted]d gestation. Her obstetrical history is significant for none. . Relationship with FOB: significant other, living together. Patient does intend to breast feed. Pregnancy history fully reviewed.  Patient reports no complaints. Patient had pap last year, and it was abnormal but patient doesn't remember what was wrong. SHe thinks she was told that she needed another pap in year.   Review of Systems:   Review of Systems  Constitutional: Negative.   Eyes: Negative.   Genitourinary: Negative.   Neurological: Negative.     Objective:     BP 124/85   Pulse (!) 101   Wt 94 lb 1.6 oz (42.7 kg)   LMP 06/15/2020   BMI 17.78 kg/m  Physical Exam Constitutional:      Appearance: Normal appearance.  HENT:     Head: Normocephalic.  Cardiovascular:     Pulses: Normal pulses.  Abdominal:     General: Abdomen is flat.  Neurological:     Mental Status: She is alert.     Exam    Assessment:    Pregnancy: G2P0010 Patient Active Problem List   Diagnosis Date Noted  . Supervision of low-risk pregnancy, first trimester 09/28/2020  . Hyperemesis affecting pregnancy, antepartum 09/15/2020       Plan:     Initial labs drawn. Prenatal vitamins. Problem list reviewed and updated. AFP3 discussed: will do a future visit. Role of ultrasound in pregnancy discussed; fetal survey: requested. Amniocentesis discussed: not indicated. Follow up in 5 weeks. 75% of 30 min visit spent on counseling and coordination of care.  -Patient has BP cuff; reviewed BP and warning signs -she will get COVID booster -Welcomed patient to practice, explained role of students, NPs, MDs, she understands how practice works  [employer] Tabitha Tupper 09/28/2020

## 2020-09-29 LAB — CBC/D/PLT+RPR+RH+ABO+RUB AB...
Antibody Screen: NEGATIVE
Basophils Absolute: 0 10*3/uL (ref 0.0–0.2)
Basos: 0 %
EOS (ABSOLUTE): 0 10*3/uL (ref 0.0–0.4)
Eos: 0 %
HCV Ab: <0.1 s/co ratio (ref 0.0–0.9)
HIV Screen 4th Generation wRfx: NONREACTIVE
Hematocrit: 37.2 % (ref 34.0–46.6)
Hemoglobin: 12.7 g/dL (ref 11.1–15.9)
Hepatitis B Surface Ag: NEGATIVE
Immature Grans (Abs): 0 10*3/uL (ref 0.0–0.1)
Immature Granulocytes: 0 %
Lymphocytes Absolute: 1.5 10*3/uL (ref 0.7–3.1)
Lymphs: 19 %
MCH: 32.3 pg (ref 26.6–33.0)
MCHC: 34.1 g/dL (ref 31.5–35.7)
MCV: 95 fL (ref 79–97)
Monocytes Absolute: 0.4 10*3/uL (ref 0.1–0.9)
Monocytes: 5 %
Neutrophils Absolute: 6.1 10*3/uL (ref 1.4–7.0)
Neutrophils: 76 %
Platelets: 357 10*3/uL (ref 150–450)
RBC: 3.93 x10E6/uL (ref 3.77–5.28)
RDW: 12.1 % (ref 11.7–15.4)
RPR Ser Ql: NONREACTIVE
Rh Factor: POSITIVE
Rubella Antibodies, IGG: 4.27 index (ref >0.99)
WBC: 8 10*3/uL (ref 3.4–10.8)

## 2020-09-29 LAB — CYTOLOGY - PAP
Chlamydia: NEGATIVE
Comment: NEGATIVE
Comment: NEGATIVE
Comment: NORMAL
Diagnosis: NEGATIVE
Neisseria Gonorrhea: NEGATIVE
Trichomonas: NEGATIVE

## 2020-09-29 LAB — HCV INTERPRETATION

## 2020-09-30 LAB — URINE CULTURE, OB REFLEX

## 2020-09-30 LAB — CULTURE, OB URINE

## 2020-11-02 ENCOUNTER — Telehealth (INDEPENDENT_AMBULATORY_CARE_PROVIDER_SITE_OTHER): Payer: Self-pay | Admitting: Advanced Practice Midwife

## 2020-11-02 ENCOUNTER — Encounter: Payer: Self-pay | Admitting: Advanced Practice Midwife

## 2020-11-02 DIAGNOSIS — O210 Mild hyperemesis gravidarum: Secondary | ICD-10-CM

## 2020-11-02 DIAGNOSIS — Z3491 Encounter for supervision of normal pregnancy, unspecified, first trimester: Secondary | ICD-10-CM

## 2020-11-02 DIAGNOSIS — Z3A16 16 weeks gestation of pregnancy: Secondary | ICD-10-CM

## 2020-11-02 NOTE — Progress Notes (Signed)
    TELEHEALTH OBSTETRICS VISIT ENCOUNTER NOTE  Provider location: [facility] [employer] [facility]   I connected with Rolena Infante on 11/02/20 at  9:55 AM EST by Mychart video at home and verified that I am speaking with the correct person using two identifiers.    I discussed the limitations, risks, security and privacy concerns of performing an evaluation and management service by virtual/video and the availability of in person appointments. I also discussed with the patient that there may be a patient responsible charge related to this service. The patient expressed understanding and agreed to proceed.  Subjective:  Laura Moyer is a 25 y.o. G2P0010 at [redacted]w[redacted]d being followed for ongoing prenatal care.  She is currently monitored for the following issues for this low-risk pregnancy and has Hyperemesis affecting pregnancy, antepartum and Supervision of low-risk pregnancy, first trimester on their problem list.  Patient reports no complaints. Reports fetal movement. Denies any contractions, bleeding or leaking of fluid.   The following portions of the patient's history were reviewed and updated as appropriate: allergies, current medications, past family history, past medical history, past social history, past surgical history and problem list.   Objective:  Blood pressure 113/77, pulse 83, last menstrual period 06/15/2020, unknown if currently breastfeeding. General:  Alert, oriented and cooperative.   Mental Status: Normal mood and affect perceived. Normal judgment and thought content.  Rest of physical exam deferred due to type of encounter  Assessment and Plan:  Pregnancy: G2P0010 at [redacted]w[redacted]d 1. Supervision of low-risk pregnancy, first trimester -routine care  2. Hyperemesis affecting pregnancy, antepartum - improving   Preterm labor symptoms and general obstetric precautions including but not limited to vaginal bleeding, contractions, leaking of fluid and  fetal movement were reviewed in detail with the patient.  I discussed the assessment and treatment plan with the patient. The patient was provided an opportunity to ask questions and all were answered. The patient agreed with the plan and demonstrated an understanding of the instructions. The patient was advised to call back or seek an in-person office evaluation/go to MAU at [facility] for any urgent or concerning symptoms. Please refer to After Visit Summary for other counseling recommendations.   I provided 12 minutes of non-face-to-face time during this encounter.  Return in 19 days (on 11/21/2020).  Future Appointments  Date Time Provider Department Center  11/21/2020  9:45 AM [facility] US[facility]    Thressa Sheller DNP, CNM  11/02/20  10:03 AM

## 2020-11-02 NOTE — Progress Notes (Signed)
I connected with  Rolena Infante on 11/02/20 at  9:55 AM EST by telephone and verified that I am speaking with the correct person using two identifiers.   I discussed the limitations, risks, security and privacy concerns of performing an evaluation and management service by telephone and the availability of in person appointments. I also discussed with the patient that there may be a patient responsible charge related to this service. The patient expressed understanding and agreed to proceed.  Ralene Bathe, RN 11/02/2020  9:49 AM

## 2020-11-11 ENCOUNTER — Encounter: Payer: Self-pay | Admitting: *Deleted

## 2020-11-21 ENCOUNTER — Other Ambulatory Visit: Payer: Self-pay

## 2020-11-21 ENCOUNTER — Other Ambulatory Visit: Payer: Self-pay | Admitting: *Deleted

## 2020-11-21 ENCOUNTER — Ambulatory Visit: Payer: Self-pay | Attending: Student

## 2020-11-21 DIAGNOSIS — Z3491 Encounter for supervision of normal pregnancy, unspecified, first trimester: Secondary | ICD-10-CM

## 2020-11-21 DIAGNOSIS — O3662X0 Maternal care for excessive fetal growth, second trimester, not applicable or unspecified: Secondary | ICD-10-CM

## 2020-12-05 ENCOUNTER — Encounter: Payer: Self-pay | Admitting: *Deleted

## 2021-01-02 ENCOUNTER — Other Ambulatory Visit: Payer: Self-pay | Admitting: *Deleted

## 2021-01-02 ENCOUNTER — Ambulatory Visit: Payer: Medicaid Other | Attending: Obstetrics and Gynecology

## 2021-01-02 ENCOUNTER — Ambulatory Visit: Payer: Self-pay

## 2021-01-02 ENCOUNTER — Other Ambulatory Visit: Payer: Self-pay

## 2021-01-02 DIAGNOSIS — Z3A25 25 weeks gestation of pregnancy: Secondary | ICD-10-CM

## 2021-01-02 DIAGNOSIS — O3662X0 Maternal care for excessive fetal growth, second trimester, not applicable or unspecified: Secondary | ICD-10-CM | POA: Insufficient documentation

## 2021-01-02 DIAGNOSIS — O368390 Maternal care for abnormalities of the fetal heart rate or rhythm, unspecified trimester, not applicable or unspecified: Secondary | ICD-10-CM

## 2021-01-02 DIAGNOSIS — O368320 Maternal care for abnormalities of the fetal heart rate or rhythm, second trimester, not applicable or unspecified: Secondary | ICD-10-CM

## 2021-02-22 ENCOUNTER — Telehealth: Payer: Self-pay

## 2021-02-22 NOTE — Telephone Encounter (Addendum)
Misty Stanley, prenatal navigator, inquired about patient. Per chart review pt has not been seen since February 2022. Called pt; VM left stating I am calling to follow up on prenatal care. Offered for pt to call the office to schedule follow up OB appt. MyChart message sent.

## 2021-03-02 ENCOUNTER — Ambulatory Visit (INDEPENDENT_AMBULATORY_CARE_PROVIDER_SITE_OTHER): Payer: Self-pay | Admitting: Obstetrics & Gynecology

## 2021-03-02 ENCOUNTER — Other Ambulatory Visit: Payer: Self-pay

## 2021-03-02 VITALS — BP 129/83 | HR 100 | Wt 141.0 lb

## 2021-03-02 DIAGNOSIS — Z3491 Encounter for supervision of normal pregnancy, unspecified, first trimester: Secondary | ICD-10-CM

## 2021-03-02 NOTE — Progress Notes (Signed)
   PRENATAL VISIT NOTE  Subjective:  Laura Moyer is a 25 y.o. G2P0010 at [redacted]w[redacted]d being seen today for ongoing prenatal care.  She is currently monitored for the following issues for this low-risk pregnancy and has Hyperemesis affecting pregnancy, antepartum and Supervision of low-risk pregnancy, first trimester on their problem list.  Patient reports no complaints.  Contractions: Irritability. Vag. Bleeding: None.  Movement: Present. Denies leaking of fluid.   The following portions of the patient's history were reviewed and updated as appropriate: allergies, current medications, past family history, past medical history, past social history, past surgical history and problem list.   Objective:   Vitals:   03/02/21 1051  BP: 129/83  Pulse: 100  Weight: 141 lb (64 kg)    Fetal Status: Fetal Heart Rate (bpm): 151   Movement: Present     General:  Alert, oriented and cooperative. Patient is in no acute distress.  Skin: Skin is warm and dry. No rash noted.   Cardiovascular: Normal heart rate noted  Respiratory: Normal respiratory effort, no problems with respiration noted  Abdomen: Soft, gravid, appropriate for gestational age.  Pain/Pressure: Present     Pelvic: Cervical exam deferred        Extremities: Normal range of motion.  Edema: None  Mental Status: Normal mood and affect. Normal behavior. Normal judgment and thought content.   Assessment and Plan:  Pregnancy: G2P0010 at [redacted]w[redacted]d 1. Supervision of low-risk pregnancy, first trimester Has not been seen for months. She will have GTT asap and f/u  Preterm labor symptoms and general obstetric precautions including but not limited to vaginal bleeding, contractions, leaking of fluid and fetal movement were reviewed in detail with the patient. Please refer to After Visit Summary for other counseling recommendations.   Return in about 2 weeks (around 03/16/2021).  Future Appointments  Date Time Provider Department Center  04/11/2021  10:30 AM [facility] NURSE [facility]  04/11/2021 10:45 AM [facility]    Scheryl Darter, MD

## 2021-03-03 ENCOUNTER — Other Ambulatory Visit: Payer: Self-pay

## 2021-03-03 DIAGNOSIS — Z3491 Encounter for supervision of normal pregnancy, unspecified, first trimester: Secondary | ICD-10-CM

## 2021-03-04 LAB — CBC
Hematocrit: 33.5 % — ABNORMAL LOW (ref 34.0–46.6)
Hemoglobin: 11.3 g/dL (ref 11.1–15.9)
MCH: 31.8 pg (ref 26.6–33.0)
MCHC: 33.7 g/dL (ref 31.5–35.7)
MCV: 94 fL (ref 79–97)
Platelets: 223 10*3/uL (ref 150–450)
RBC: 3.55 x10E6/uL — ABNORMAL LOW (ref 3.77–5.28)
RDW: 12.1 % (ref 11.7–15.4)
WBC: 8.5 10*3/uL (ref 3.4–10.8)

## 2021-03-04 LAB — GLUCOSE TOLERANCE, 2 HOURS W/ 1HR
Glucose, 1 hour: 97 mg/dL (ref 65–179)
Glucose, 2 hour: 108 mg/dL (ref 65–152)
Glucose, Fasting: 80 mg/dL (ref 65–91)

## 2021-03-04 LAB — HIV ANTIBODY (ROUTINE TESTING W REFLEX): HIV Screen 4th Generation wRfx: NONREACTIVE

## 2021-03-04 LAB — RPR: RPR Ser Ql: NONREACTIVE

## 2021-03-17 ENCOUNTER — Ambulatory Visit (INDEPENDENT_AMBULATORY_CARE_PROVIDER_SITE_OTHER): Payer: Medicaid Other | Admitting: Family Medicine

## 2021-03-17 ENCOUNTER — Other Ambulatory Visit: Payer: Self-pay

## 2021-03-17 VITALS — BP 119/80 | HR 93 | Wt 145.0 lb

## 2021-03-17 DIAGNOSIS — Z23 Encounter for immunization: Secondary | ICD-10-CM | POA: Diagnosis not present

## 2021-03-17 DIAGNOSIS — Z3491 Encounter for supervision of normal pregnancy, unspecified, first trimester: Secondary | ICD-10-CM

## 2021-03-17 NOTE — Patient Instructions (Signed)

## 2021-03-17 NOTE — Progress Notes (Signed)
   Subjective:  Laura Moyer is a 25 y.o. G2P0010 at [redacted]w[redacted]d being seen today for ongoing prenatal care.  She is currently monitored for the following issues for this low-risk pregnancy and has Hyperemesis affecting pregnancy, antepartum and Supervision of low-risk pregnancy, first trimester on their problem list.  Patient reports no complaints.  Contractions: Irritability. Vag. Bleeding: None.  Movement: Present. Denies leaking of fluid.   The following portions of the patient's history were reviewed and updated as appropriate: allergies, current medications, past family history, past medical history, past social history, past surgical history and problem list. Problem list updated.  Objective:   Vitals:   03/17/21 1054  BP: 119/80  Pulse: 93  Weight: 145 lb (65.8 kg)    Fetal Status: Fetal Heart Rate (bpm): 142   Movement: Present     General:  Alert, oriented and cooperative. Patient is in no acute distress.  Skin: Skin is warm and dry. No rash noted.   Cardiovascular: Normal heart rate noted  Respiratory: Normal respiratory effort, no problems with respiration noted  Abdomen: Soft, gravid, appropriate for gestational age. Pain/Pressure: Present     Pelvic: Vag. Bleeding: None     Cervical exam deferred        Extremities: Normal range of motion.  Edema: None  Mental Status: Normal mood and affect. Normal behavior. Normal judgment and thought content.   Urinalysis:      Assessment and Plan:  Pregnancy: G2P0010 at [redacted]w[redacted]d  1. Supervision of low-risk pregnancy, first trimester BP and FHR normal Has follow up growth in a few weeks to ensure fetal arrythmia seen on anatomy scan (but not follow up scan) has resolved TDAP today - Tdap vaccine greater than or equal to 7yo IM  Preterm labor symptoms and general obstetric precautions including but not limited to vaginal bleeding, contractions, leaking of fluid and fetal movement were reviewed in detail with the patient. Please refer  to After Visit Summary for other counseling recommendations.  Return in 1 week (on 03/24/2021) for ob visit.   Venora Maples, MD

## 2021-03-29 ENCOUNTER — Other Ambulatory Visit: Payer: Self-pay

## 2021-03-29 ENCOUNTER — Other Ambulatory Visit (HOSPITAL_COMMUNITY)
Admission: RE | Admit: 2021-03-29 | Discharge: 2021-03-29 | Disposition: A | Discharge status: Home / Self Care | Payer: Medicaid Other | Source: Ambulatory Visit | Attending: Family Medicine | Admitting: Family Medicine

## 2021-03-29 ENCOUNTER — Ambulatory Visit (INDEPENDENT_AMBULATORY_CARE_PROVIDER_SITE_OTHER): Payer: Self-pay | Admitting: Family Medicine

## 2021-03-29 VITALS — BP 109/74 | HR 89 | Wt 154.3 lb

## 2021-03-29 DIAGNOSIS — Z3A37 37 weeks gestation of pregnancy: Secondary | ICD-10-CM

## 2021-03-29 DIAGNOSIS — Z3491 Encounter for supervision of normal pregnancy, unspecified, first trimester: Secondary | ICD-10-CM | POA: Diagnosis not present

## 2021-03-29 NOTE — Progress Notes (Signed)
   PRENATAL VISIT NOTE  Subjective:  Laura Moyer is a 25 y.o. G2P0010 at [redacted]w[redacted]d being seen today for ongoing prenatal care.  She is currently monitored for the following issues for this low-risk pregnancy and has Hyperemesis affecting pregnancy, antepartum and Supervision of low-risk pregnancy, first trimester on their problem list.  Patient reports occasional contractions.  Contractions: Irritability. Vag. Bleeding: None.  Movement: Present. Denies leaking of fluid.   The following portions of the patient's history were reviewed and updated as appropriate: allergies, current medications, past family history, past medical history, past social history, past surgical history and problem list.   Objective:   Vitals:   03/29/21 1029  BP: 109/74  Pulse: 89  Weight: 154 lb 4.8 oz (70 kg)    Fetal Status: Fetal Heart Rate (bpm): 146   Movement: Present  Presentation: Vertex  General:  Alert, oriented and cooperative. Patient is in no acute distress.  Skin: Skin is warm and dry. No rash noted.   Cardiovascular: Normal heart rate noted  Respiratory: Normal respiratory effort, no problems with respiration noted  Abdomen: Soft, gravid, appropriate for gestational age.  Pain/Pressure: Present     Pelvic: Cervical exam performed in the presence of a chaperone Dilation: 1 Effacement (%): 50 Station: -3  Extremities: Normal range of motion.  Edema: None  Mental Status: Normal mood and affect. Normal behavior. Normal judgment and thought content.   Assessment and Plan:  Pregnancy: G2P0010 at [redacted]w[redacted]d 1. Supervision of low-risk pregnancy, first trimester FHT and FH normal  Term labor symptoms and general obstetric precautions including but not limited to vaginal bleeding, contractions, leaking of fluid and fetal movement were reviewed in detail with the patient. Please refer to After Visit Summary for other counseling recommendations.   Return in about 1 week (around 04/05/2021) for LR OB  f/u.  Future Appointments  Date Time Provider Department Center  04/11/2021 10:30 AM [facility] NURSE [facility]  04/11/2021 10:45 AM [facility] US4 [facility]    Levie Heritage, DO

## 2021-03-30 LAB — GC/CHLAMYDIA PROBE AMP (~~LOC~~) NOT AT ARMC
Chlamydia: NEGATIVE
Comment: NEGATIVE
Comment: NORMAL
Neisseria Gonorrhea: NEGATIVE

## 2021-03-31 DIAGNOSIS — Z20822 Contact with and (suspected) exposure to covid-19: Secondary | ICD-10-CM | POA: Diagnosis not present

## 2021-04-02 LAB — CULTURE, BETA STREP (GROUP B ONLY): Strep Gp B Culture: NEGATIVE

## 2021-04-05 ENCOUNTER — Ambulatory Visit (INDEPENDENT_AMBULATORY_CARE_PROVIDER_SITE_OTHER): Payer: Self-pay | Admitting: Obstetrics and Gynecology

## 2021-04-05 ENCOUNTER — Other Ambulatory Visit: Payer: Self-pay

## 2021-04-05 ENCOUNTER — Encounter: Payer: Self-pay | Admitting: Obstetrics and Gynecology

## 2021-04-05 VITALS — BP 120/73 | HR 87 | Wt 152.9 lb

## 2021-04-05 DIAGNOSIS — Z3491 Encounter for supervision of normal pregnancy, unspecified, first trimester: Secondary | ICD-10-CM

## 2021-04-05 NOTE — Progress Notes (Signed)
   PRENATAL VISIT NOTE  Subjective:  Laura Moyer is a 25 y.o. G2P0010 at [redacted]w[redacted]d being seen today for ongoing prenatal care.  She is currently monitored for the following issues for this low-risk pregnancy and has Hyperemesis affecting pregnancy, antepartum and Supervision of low-risk pregnancy, first trimester on their problem list.  Patient reports no complaints.  Contractions: Irritability. Vag. Bleeding: None.  Movement: Present. Denies leaking of fluid.   The following portions of the patient's history were reviewed and updated as appropriate: allergies, current medications, past family history, past medical history, past social history, past surgical history and problem list.   Objective:   Vitals:   04/05/21 1337  BP: 120/73  Pulse: 87  Weight: 152 lb 14.4 oz (69.4 kg)    Fetal Status: Fetal Heart Rate (bpm): 145 Fundal Height: 38 cm Movement: Present     General:  Alert, oriented and cooperative. Patient is in no acute distress.  Skin: Skin is warm and dry. No rash noted.   Cardiovascular: Normal heart rate noted  Respiratory: Normal respiratory effort, no problems with respiration noted  Abdomen: Soft, gravid, appropriate for gestational age.  Pain/Pressure: Present     Pelvic: Cervical exam deferred        Extremities: Normal range of motion.  Edema: Trace  Mental Status: Normal mood and affect. Normal behavior. Normal judgment and thought content.   Assessment and Plan:  Pregnancy: G2P0010 at [redacted]w[redacted]d 1. Supervision of low-risk pregnancy, first trimester Patient is doing well without complaints Discussed IOL at 41 weeks   Term labor symptoms and general obstetric precautions including but not limited to vaginal bleeding, contractions, leaking of fluid and fetal movement were reviewed in detail with the patient. Please refer to After Visit Summary for other counseling recommendations.   Return in about 1 week (around 04/12/2021) for in person, ROB, Low risk.  Future  Appointments  Date Time Provider Department Center  04/11/2021  8:55 AM Currie Paris, NP [facility]  04/11/2021 10:30 AM [facility] NURSE [facility]  04/11/2021 10:45 AM [facility]    Catalina Antigua, MD

## 2021-04-11 ENCOUNTER — Other Ambulatory Visit: Payer: Self-pay

## 2021-04-11 ENCOUNTER — Ambulatory Visit (INDEPENDENT_AMBULATORY_CARE_PROVIDER_SITE_OTHER): Payer: Medicaid Other | Admitting: Nurse Practitioner

## 2021-04-11 ENCOUNTER — Ambulatory Visit: Payer: Medicaid Other | Admitting: *Deleted

## 2021-04-11 ENCOUNTER — Encounter: Payer: Self-pay | Admitting: *Deleted

## 2021-04-11 ENCOUNTER — Ambulatory Visit: Payer: Medicaid Other | Attending: Obstetrics

## 2021-04-11 VITALS — BP 119/66 | HR 75

## 2021-04-11 VITALS — BP 118/84 | HR 87 | Wt 157.5 lb

## 2021-04-11 DIAGNOSIS — Z3491 Encounter for supervision of normal pregnancy, unspecified, first trimester: Secondary | ICD-10-CM

## 2021-04-11 DIAGNOSIS — Z3A39 39 weeks gestation of pregnancy: Secondary | ICD-10-CM

## 2021-04-11 DIAGNOSIS — O368390 Maternal care for abnormalities of the fetal heart rate or rhythm, unspecified trimester, not applicable or unspecified: Secondary | ICD-10-CM | POA: Diagnosis present

## 2021-04-11 DIAGNOSIS — O368330 Maternal care for abnormalities of the fetal heart rate or rhythm, third trimester, not applicable or unspecified: Secondary | ICD-10-CM | POA: Diagnosis not present

## 2021-04-11 DIAGNOSIS — O210 Mild hyperemesis gravidarum: Secondary | ICD-10-CM

## 2021-04-11 DIAGNOSIS — Z362 Encounter for other antenatal screening follow-up: Secondary | ICD-10-CM | POA: Diagnosis not present

## 2021-04-11 NOTE — Progress Notes (Signed)
Pt states has been passing some musous.

## 2021-04-11 NOTE — Addendum Note (Signed)
Addended by: Currie Paris on: 04/11/2021 10:08 AM   Modules accepted: Orders, SmartSet

## 2021-04-11 NOTE — Progress Notes (Signed)
    Subjective:  Laura Moyer is a 25 y.o. G2P0010 at [redacted]w[redacted]d being seen today for ongoing prenatal care.  She is currently monitored for the following issues for this low-risk pregnancy and has Hyperemesis affecting pregnancy, antepartum and Supervision of low-risk pregnancy, first trimester on their problem list.  Patient reports  has had a couple of times when she had portions of mucus plug come out.  Having occasional contractions .  Contractions: Irritability. Vag. Bleeding: None.  Movement: Present. Denies leaking of fluid.   The following portions of the patient's history were reviewed and updated as appropriate: allergies, current medications, past family history, past medical history, past social history, past surgical history and problem list. Problem list updated.  Objective:   Vitals:   04/11/21 0904  BP: 118/84  Pulse: 87  Weight: 157 lb 8 oz (71.4 kg)    Fetal Status: Fetal Heart Rate (bpm): 154 Fundal Height: 38 cm Movement: Present  Presentation: Vertex  General:  Alert, oriented and cooperative. Patient is in no acute distress.  Skin: Skin is warm and dry. No rash noted.   Cardiovascular: Normal heart rate noted  Respiratory: Normal respiratory effort, no problems with respiration noted  Abdomen: Soft, gravid, appropriate for gestational age. Pain/Pressure: Present     Pelvic:  Cervical exam performed Dilation: 3 Effacement (%): 50 Station: -2  Extremities: Normal range of motion.  Edema: Trace  Mental Status: Normal mood and affect. Normal behavior. Normal judgment and thought content.   Urinalysis:      Assessment and Plan:  Pregnancy: G2P0010 at [redacted]w[redacted]d  1. Supervision of low-risk pregnancy, first trimester IOL orders done Has [place] today for previous fetal arrythmia  2. [redacted] weeks gestation of pregnancy   Term labor symptoms and general obstetric precautions including but not limited to vaginal bleeding, contractions, leaking of fluid and fetal movement were  reviewed in detail with the patient. Please refer to After Visit Summary for other counseling recommendations.  Return in about 1 week (around 04/18/2021) for in person ROB.  Nolene Bernheim, RN, MSN, NP-BC Nurse Practitioner, [facility] for [employer], [facility] 04/11/2021 9:31 AM

## 2021-04-17 ENCOUNTER — Inpatient Hospital Stay (HOSPITAL_COMMUNITY): Admit: 2021-04-17 | Payer: Self-pay

## 2021-04-17 ENCOUNTER — Telehealth (HOSPITAL_COMMUNITY): Payer: Self-pay | Admitting: *Deleted

## 2021-04-17 NOTE — Telephone Encounter (Signed)
Preadmission screen  

## 2021-04-18 ENCOUNTER — Inpatient Hospital Stay (HOSPITAL_COMMUNITY): Payer: Medicaid Other | Admitting: Anesthesiology

## 2021-04-18 ENCOUNTER — Telehealth (HOSPITAL_COMMUNITY): Payer: Self-pay | Admitting: *Deleted

## 2021-04-18 ENCOUNTER — Other Ambulatory Visit: Payer: Self-pay

## 2021-04-18 ENCOUNTER — Inpatient Hospital Stay (HOSPITAL_COMMUNITY)
Admission: AD | Admit: 2021-04-18 | Discharge: 2021-04-21 | DRG: 787 | Disposition: A | Discharge status: Home / Self Care | Payer: Medicaid Other | Attending: Obstetrics and Gynecology | Admitting: Obstetrics and Gynecology

## 2021-04-18 ENCOUNTER — Encounter (HOSPITAL_COMMUNITY): Payer: Self-pay | Admitting: Obstetrics & Gynecology

## 2021-04-18 ENCOUNTER — Encounter (HOSPITAL_COMMUNITY)
Admission: AD | Disposition: A | Discharge status: Home / Self Care | Payer: Self-pay | Source: Home / Self Care | Attending: Obstetrics and Gynecology

## 2021-04-18 DIAGNOSIS — O321XX0 Maternal care for breech presentation, not applicable or unspecified: Secondary | ICD-10-CM | POA: Diagnosis present

## 2021-04-18 DIAGNOSIS — O9081 Anemia of the puerperium: Secondary | ICD-10-CM | POA: Diagnosis not present

## 2021-04-18 DIAGNOSIS — O4212 Full-term premature rupture of membranes, onset of labor more than 24 hours following rupture: Secondary | ICD-10-CM | POA: Diagnosis not present

## 2021-04-18 DIAGNOSIS — Z3689 Encounter for other specified antenatal screening: Secondary | ICD-10-CM | POA: Diagnosis not present

## 2021-04-18 DIAGNOSIS — Z3A00 Weeks of gestation of pregnancy not specified: Secondary | ICD-10-CM

## 2021-04-18 DIAGNOSIS — O1404 Mild to moderate pre-eclampsia, complicating childbirth: Principal | ICD-10-CM | POA: Diagnosis present

## 2021-04-18 DIAGNOSIS — D62 Acute posthemorrhagic anemia: Secondary | ICD-10-CM | POA: Diagnosis not present

## 2021-04-18 DIAGNOSIS — O480 Post-term pregnancy: Secondary | ICD-10-CM | POA: Diagnosis not present

## 2021-04-18 DIAGNOSIS — O26893 Other specified pregnancy related conditions, third trimester: Secondary | ICD-10-CM | POA: Diagnosis not present

## 2021-04-18 DIAGNOSIS — Z3491 Encounter for supervision of normal pregnancy, unspecified, first trimester: Secondary | ICD-10-CM

## 2021-04-18 DIAGNOSIS — O210 Mild hyperemesis gravidarum: Secondary | ICD-10-CM

## 2021-04-18 DIAGNOSIS — Z20822 Contact with and (suspected) exposure to covid-19: Secondary | ICD-10-CM | POA: Diagnosis not present

## 2021-04-18 DIAGNOSIS — Z3A40 40 weeks gestation of pregnancy: Secondary | ICD-10-CM | POA: Diagnosis not present

## 2021-04-18 DIAGNOSIS — O43813 Placental infarction, third trimester: Secondary | ICD-10-CM | POA: Diagnosis not present

## 2021-04-18 LAB — RAPID URINE DRUG SCREEN, HOSP PERFORMED
Amphetamines: NOT DETECTED
Barbiturates: NOT DETECTED
Benzodiazepines: NOT DETECTED
Cocaine: NOT DETECTED
Opiates: NOT DETECTED
Tetrahydrocannabinol: NOT DETECTED

## 2021-04-18 LAB — URINALYSIS, ROUTINE W REFLEX MICROSCOPIC
Bilirubin Urine: NEGATIVE
Glucose, UA: NEGATIVE mg/dL
Hgb urine dipstick: NEGATIVE
Ketones, ur: NEGATIVE mg/dL
Leukocytes,Ua: NEGATIVE
Nitrite: NEGATIVE
Protein, ur: NEGATIVE mg/dL
Specific Gravity, Urine: 1.014 (ref 1.005–1.030)
pH: 7 (ref 5.0–8.0)

## 2021-04-18 LAB — CREATININE, SERUM
Creatinine, Ser: 0.63 mg/dL (ref 0.44–1.00)
GFR, Estimated: >60 mL/min (ref >60)

## 2021-04-18 LAB — TYPE AND SCREEN
ABO/RH(D): B POS
Antibody Screen: NEGATIVE

## 2021-04-18 LAB — CBC
HCT: 39.6 % (ref 36.0–46.0)
Hemoglobin: 13.1 g/dL (ref 12.0–15.0)
MCH: 32.3 pg (ref 26.0–34.0)
MCHC: 33.1 g/dL (ref 30.0–36.0)
MCV: 97.5 fL (ref 80.0–100.0)
Platelets: 177 10*3/uL (ref 150–400)
RBC: 4.06 MIL/uL (ref 3.87–5.11)
RDW: 14.9 % (ref 11.5–15.5)
WBC: 9 10*3/uL (ref 4.0–10.5)
nRBC: 0 % (ref 0.0–0.2)

## 2021-04-18 LAB — PROTEIN / CREATININE RATIO, URINE
Creatinine, Urine: 73.13 mg/dL
Protein Creatinine Ratio: 0.33 mg/mg{Cre} — ABNORMAL HIGH (ref 0.00–0.15)
Total Protein, Urine: 24 mg/dL

## 2021-04-18 LAB — RPR: RPR Ser Ql: NONREACTIVE

## 2021-04-18 LAB — RESP PANEL BY RT-PCR (FLU A&B, COVID) ARPGX2
Influenza A by PCR: NEGATIVE
Influenza B by PCR: NEGATIVE
SARS Coronavirus 2 by RT PCR: NEGATIVE

## 2021-04-18 SURGERY — Surgical Case
Anesthesia: Spinal

## 2021-04-18 MED ORDER — PHENYLEPHRINE HCL-NACL 20-0.9 MG/250ML-% IV SOLN
INTRAVENOUS | Status: AC
  Filled 2021-04-18: qty 250

## 2021-04-18 MED ORDER — MEPERIDINE HCL 25 MG/ML IJ SOLN: 6.2500 mg | INTRAMUSCULAR | Status: DC | PRN

## 2021-04-18 MED ORDER — SCOPOLAMINE 1 MG/3DAYS TD PT72
1.0000 | MEDICATED_PATCH | Freq: Once | TRANSDERMAL | Status: AC
  Administered 2021-04-18: 1.5 mg via TRANSDERMAL
  Filled 2021-04-18: qty 1

## 2021-04-18 MED ORDER — DIPHENHYDRAMINE HCL 25 MG PO CAPS: 25.0000 mg | ORAL_CAPSULE | Freq: Four times a day (QID) | ORAL | Status: DC | PRN

## 2021-04-18 MED ORDER — PROMETHAZINE HCL 25 MG/ML IJ SOLN: 6.2500 mg | INTRAMUSCULAR | Status: DC | PRN

## 2021-04-18 MED ORDER — SENNOSIDES-DOCUSATE SODIUM 8.6-50 MG PO TABS
2.0000 | ORAL_TABLET | Freq: Every day | ORAL | Status: DC
  Administered 2021-04-19 – 2021-04-21 (×3): 2 via ORAL
  Filled 2021-04-18 (×3): qty 2

## 2021-04-18 MED ORDER — ZOLPIDEM TARTRATE 5 MG PO TABS: 5.0000 mg | ORAL_TABLET | Freq: Every evening | ORAL | Status: DC | PRN

## 2021-04-18 MED ORDER — SOD CITRATE-CITRIC ACID 500-334 MG/5ML PO SOLN
30.0000 mL | Freq: Once | ORAL | Status: AC
  Administered 2021-04-18: 30 mL via ORAL
  Filled 2021-04-18: qty 30

## 2021-04-18 MED ORDER — KETOROLAC TROMETHAMINE 30 MG/ML IJ SOLN: 30.0000 mg | Freq: Four times a day (QID) | INTRAMUSCULAR | Status: AC | PRN

## 2021-04-18 MED ORDER — WITCH HAZEL-GLYCERIN EX PADS: 1.0000 "application " | MEDICATED_PAD | CUTANEOUS | Status: DC | PRN

## 2021-04-18 MED ORDER — BUPIVACAINE IN DEXTROSE 0.75-8.25 % IT SOLN
INTRATHECAL | Status: DC | PRN
  Administered 2021-04-18: 1.7 mL via INTRATHECAL

## 2021-04-18 MED ORDER — DEXAMETHASONE SODIUM PHOSPHATE 10 MG/ML IJ SOLN
INTRAMUSCULAR | Status: AC
  Filled 2021-04-18: qty 1

## 2021-04-18 MED ORDER — NALBUPHINE HCL 10 MG/ML IJ SOLN: 5.0000 mg | INTRAMUSCULAR | Status: DC | PRN

## 2021-04-18 MED ORDER — DEXTROSE 5 % IV SOLN
1.0000 ug/kg/h | INTRAVENOUS | Status: DC | PRN
  Filled 2021-04-18: qty 5

## 2021-04-18 MED ORDER — MORPHINE SULFATE (PF) 0.5 MG/ML IJ SOLN
INTRAMUSCULAR | Status: AC
  Filled 2021-04-18: qty 10

## 2021-04-18 MED ORDER — ONDANSETRON HCL 4 MG/2ML IJ SOLN: 4.0000 mg | Freq: Three times a day (TID) | INTRAMUSCULAR | Status: DC | PRN

## 2021-04-18 MED ORDER — LACTATED RINGERS IV SOLN: INTRAVENOUS | Status: DC

## 2021-04-18 MED ORDER — ONDANSETRON HCL 4 MG/2ML IJ SOLN
INTRAMUSCULAR | Status: AC
  Filled 2021-04-18: qty 2

## 2021-04-18 MED ORDER — ENOXAPARIN SODIUM 40 MG/0.4ML IJ SOSY
40.0000 mg | PREFILLED_SYRINGE | INTRAMUSCULAR | Status: DC
  Administered 2021-04-19 – 2021-04-21 (×3): 40 mg via SUBCUTANEOUS
  Filled 2021-04-18 (×2): qty 0.4

## 2021-04-18 MED ORDER — SODIUM CHLORIDE 0.9% FLUSH: 3.0000 mL | INTRAVENOUS | Status: DC | PRN

## 2021-04-18 MED ORDER — MENTHOL 3 MG MT LOZG: 1.0000 | LOZENGE | OROMUCOSAL | Status: DC | PRN

## 2021-04-18 MED ORDER — SIMETHICONE 80 MG PO CHEW: 80.0000 mg | CHEWABLE_TABLET | ORAL | Status: DC | PRN

## 2021-04-18 MED ORDER — OXYCODONE HCL 5 MG/5ML PO SOLN
5.0000 mg | Freq: Once | ORAL | Status: DC | PRN
Start: 2021-04-18 — End: 2021-04-18

## 2021-04-18 MED ORDER — SODIUM CHLORIDE 0.9 % IV SOLN
500.0000 mg | INTRAVENOUS | Status: DC
  Administered 2021-04-18: 500 mg via INTRAVENOUS
  Filled 2021-04-18 (×2): qty 500

## 2021-04-18 MED ORDER — HYDROMORPHONE HCL 1 MG/ML IJ SOLN: 0.2500 mg | INTRAMUSCULAR | Status: DC | PRN

## 2021-04-18 MED ORDER — MORPHINE SULFATE (PF) 0.5 MG/ML IJ SOLN
INTRAMUSCULAR | Status: DC | PRN
  Administered 2021-04-18: 150 ug via INTRATHECAL

## 2021-04-18 MED ORDER — OXYTOCIN-SODIUM CHLORIDE 30-0.9 UT/500ML-% IV SOLN
INTRAVENOUS | Status: AC
  Filled 2021-04-18: qty 500

## 2021-04-18 MED ORDER — TETANUS-DIPHTH-ACELL PERTUSSIS 5-2.5-18.5 LF-MCG/0.5 IM SUSY: 0.5000 mL | PREFILLED_SYRINGE | Freq: Once | INTRAMUSCULAR | Status: DC

## 2021-04-18 MED ORDER — FENTANYL CITRATE (PF) 100 MCG/2ML IJ SOLN
INTRAMUSCULAR | Status: AC
  Filled 2021-04-18: qty 2

## 2021-04-18 MED ORDER — NALBUPHINE HCL 10 MG/ML IJ SOLN: 5.0000 mg | Freq: Once | INTRAMUSCULAR | Status: DC | PRN

## 2021-04-18 MED ORDER — DIBUCAINE (PERIANAL) 1 % EX OINT: 1.0000 "application " | TOPICAL_OINTMENT | CUTANEOUS | Status: DC | PRN

## 2021-04-18 MED ORDER — SODIUM CHLORIDE 0.9 % IV SOLN
INTRAVENOUS | Status: AC
  Filled 2021-04-18: qty 500

## 2021-04-18 MED ORDER — CEFAZOLIN SODIUM-DEXTROSE 2-4 GM/100ML-% IV SOLN
2.0000 g | INTRAVENOUS | Status: DC
  Filled 2021-04-18: qty 100

## 2021-04-18 MED ORDER — OXYCODONE HCL 5 MG PO TABS
5.0000 mg | ORAL_TABLET | ORAL | Status: DC | PRN
  Administered 2021-04-19: 5 mg via ORAL
  Filled 2021-04-18: qty 1

## 2021-04-18 MED ORDER — DIPHENHYDRAMINE HCL 25 MG PO CAPS: 25.0000 mg | ORAL_CAPSULE | ORAL | Status: DC | PRN

## 2021-04-18 MED ORDER — SIMETHICONE 80 MG PO CHEW
80.0000 mg | CHEWABLE_TABLET | Freq: Three times a day (TID) | ORAL | Status: DC
  Administered 2021-04-18 – 2021-04-21 (×7): 80 mg via ORAL
  Filled 2021-04-18 (×7): qty 1

## 2021-04-18 MED ORDER — CEFAZOLIN SODIUM-DEXTROSE 2-4 GM/100ML-% IV SOLN
2.0000 g | Freq: Three times a day (TID) | INTRAVENOUS | Status: DC
  Administered 2021-04-18 – 2021-04-19 (×3): 2 g via INTRAVENOUS
  Filled 2021-04-18 (×3): qty 100

## 2021-04-18 MED ORDER — OXYTOCIN-SODIUM CHLORIDE 30-0.9 UT/500ML-% IV SOLN
2.5000 [IU]/h | INTRAVENOUS | Status: AC
  Administered 2021-04-18: 2.5 [IU]/h via INTRAVENOUS
  Filled 2021-04-18: qty 500

## 2021-04-18 MED ORDER — DIPHENHYDRAMINE HCL 50 MG/ML IJ SOLN: 12.5000 mg | INTRAMUSCULAR | Status: DC | PRN

## 2021-04-18 MED ORDER — NALOXONE HCL 0.4 MG/ML IJ SOLN: 0.4000 mg | INTRAMUSCULAR | Status: DC | PRN

## 2021-04-18 MED ORDER — COCONUT OIL OIL
1.0000 "application " | TOPICAL_OIL | Status: DC | PRN
  Administered 2021-04-21: 1 via TOPICAL

## 2021-04-18 MED ORDER — ACETAMINOPHEN 500 MG PO TABS
1000.0000 mg | ORAL_TABLET | Freq: Four times a day (QID) | ORAL | Status: AC
  Administered 2021-04-18 – 2021-04-19 (×4): 1000 mg via ORAL
  Filled 2021-04-18 (×4): qty 2

## 2021-04-18 MED ORDER — FAMOTIDINE IN NACL 20-0.9 MG/50ML-% IV SOLN
20.0000 mg | Freq: Once | INTRAVENOUS | Status: AC
  Administered 2021-04-18: 20 mg via INTRAVENOUS
  Filled 2021-04-18: qty 50

## 2021-04-18 MED ORDER — OXYCODONE HCL 5 MG PO TABS: 5.0000 mg | ORAL_TABLET | Freq: Once | ORAL | Status: DC | PRN

## 2021-04-18 MED ORDER — OXYTOCIN-SODIUM CHLORIDE 30-0.9 UT/500ML-% IV SOLN
INTRAVENOUS | Status: DC | PRN
  Administered 2021-04-18: 30 [IU] via INTRAVENOUS

## 2021-04-18 MED ORDER — LACTATED RINGERS IV SOLN: INTRAVENOUS | Status: DC | PRN

## 2021-04-18 MED ORDER — KETOROLAC TROMETHAMINE 30 MG/ML IJ SOLN
INTRAMUSCULAR | Status: AC
  Filled 2021-04-18: qty 1

## 2021-04-18 MED ORDER — DEXAMETHASONE SODIUM PHOSPHATE 4 MG/ML IJ SOLN
INTRAMUSCULAR | Status: DC | PRN
  Administered 2021-04-18: 10 mg via INTRAVENOUS

## 2021-04-18 MED ORDER — SODIUM CHLORIDE 0.9 % IR SOLN
Status: DC | PRN
  Administered 2021-04-18: 1

## 2021-04-18 MED ORDER — OXYCODONE HCL 5 MG PO TABS
10.0000 mg | ORAL_TABLET | ORAL | Status: DC | PRN
  Administered 2021-04-19 – 2021-04-21 (×5): 10 mg via ORAL
  Filled 2021-04-18 (×5): qty 2

## 2021-04-18 MED ORDER — PRENATAL MULTIVITAMIN CH
1.0000 | ORAL_TABLET | Freq: Every day | ORAL | Status: DC
  Administered 2021-04-19 – 2021-04-20 (×2): 1 via ORAL
  Filled 2021-04-18 (×3): qty 1

## 2021-04-18 MED ORDER — KETOROLAC TROMETHAMINE 30 MG/ML IJ SOLN
30.0000 mg | Freq: Once | INTRAMUSCULAR | Status: AC | PRN
Start: 2021-04-18 — End: 2021-04-18
  Administered 2021-04-18: 30 mg via INTRAVENOUS

## 2021-04-18 MED ORDER — LACTATED RINGERS IV BOLUS
1000.0000 mL | Freq: Once | INTRAVENOUS | Status: AC
  Administered 2021-04-18: 1000 mL via INTRAVENOUS

## 2021-04-18 MED ORDER — FENTANYL CITRATE (PF) 100 MCG/2ML IJ SOLN
INTRAMUSCULAR | Status: DC | PRN
  Administered 2021-04-18: 15 ug via INTRATHECAL

## 2021-04-18 MED ORDER — PHENYLEPHRINE HCL-NACL 20-0.9 MG/250ML-% IV SOLN
INTRAVENOUS | Status: DC | PRN
  Administered 2021-04-18: 60 ug/min via INTRAVENOUS

## 2021-04-18 MED ORDER — ONDANSETRON HCL 4 MG/2ML IJ SOLN
INTRAMUSCULAR | Status: DC | PRN
  Administered 2021-04-18: 4 mg via INTRAVENOUS

## 2021-04-18 SURGICAL SUPPLY — 35 items
BENZOIN TINCTURE PRP APPL 2/3 (GAUZE/BANDAGES/DRESSINGS) ×2 IMPLANT
CHLORAPREP W/TINT 26ML (MISCELLANEOUS) ×2 IMPLANT
CLAMP CORD UMBIL (MISCELLANEOUS) IMPLANT
CLIP FILSHIE TUBAL LIGA STRL (Clip) IMPLANT
CLOSURE STERI STRIP 1/2 X4 (GAUZE/BANDAGES/DRESSINGS) ×2 IMPLANT
CLOTH BEACON ORANGE TIMEOUT ST (SAFETY) ×2 IMPLANT
DRSG OPSITE POSTOP 4X10 (GAUZE/BANDAGES/DRESSINGS) ×2 IMPLANT
ELECT REM PT RETURN 9FT ADLT (ELECTROSURGICAL) ×2
ELECTRODE REM PT RTRN 9FT ADLT (ELECTROSURGICAL) ×1 IMPLANT
EXTRACTOR VACUUM M CUP 4 TUBE (SUCTIONS) IMPLANT
GAUZE SPONGE 4X4 12PLY STRL LF (GAUZE/BANDAGES/DRESSINGS) ×4 IMPLANT
GLOVE BIOGEL PI IND STRL 7.0 (GLOVE) ×3 IMPLANT
GLOVE BIOGEL PI INDICATOR 7.0 (GLOVE) ×3
GLOVE ECLIPSE 7.0 STRL STRAW (GLOVE) ×2 IMPLANT
GOWN STRL REUS W/TWL LRG LVL3 (GOWN DISPOSABLE) ×4 IMPLANT
KIT ABG SYR 3ML LUER SLIP (SYRINGE) IMPLANT
NEEDLE HYPO 22GX1.5 SAFETY (NEEDLE) ×2 IMPLANT
NEEDLE HYPO 25X5/8 SAFETYGLIDE (NEEDLE) ×2 IMPLANT
NS IRRIG 1000ML POUR BTL (IV SOLUTION) ×2 IMPLANT
PACK C SECTION WH (CUSTOM PROCEDURE TRAY) ×2 IMPLANT
PAD ABD 7.5X8 STRL (GAUZE/BANDAGES/DRESSINGS) ×2 IMPLANT
PAD OB MATERNITY 4.3X12.25 (PERSONAL CARE ITEMS) ×2 IMPLANT
PENCIL SMOKE EVAC W/HOLSTER (ELECTROSURGICAL) ×2 IMPLANT
RTRCTR C-SECT PINK 25CM LRG (MISCELLANEOUS) IMPLANT
SUT PDS AB 0 CTX 36 PDP370T (SUTURE) IMPLANT
SUT PLAIN 2 0 XLH (SUTURE) IMPLANT
SUT VIC AB 0 CTX 36 (SUTURE) ×2
SUT VIC AB 0 CTX36XBRD ANBCTRL (SUTURE) ×2 IMPLANT
SUT VIC AB 2-0 CT1 27 (SUTURE) ×1
SUT VIC AB 2-0 CT1 TAPERPNT 27 (SUTURE) ×1 IMPLANT
SUT VIC AB 4-0 KS 27 (SUTURE) ×2 IMPLANT
SYR CONTROL 10ML LL (SYRINGE) ×2 IMPLANT
TOWEL OR 17X24 6PK STRL BLUE (TOWEL DISPOSABLE) ×2 IMPLANT
TRAY FOLEY W/BAG SLVR 14FR LF (SET/KITS/TRAYS/PACK) ×2 IMPLANT
WATER STERILE IRR 1000ML POUR (IV SOLUTION) ×2 IMPLANT

## 2021-04-18 NOTE — Anesthesia Preprocedure Evaluation (Addendum)
Anesthesia Evaluation  Patient identified by MRN, date of birth, ID band Patient awake    Reviewed: Allergy & Precautions, NPO status , Patient's Chart, lab work & pertinent test results  Airway Mallampati: II  TM Distance: >3 FB Neck ROM: Full    Dental no notable dental hx.    Pulmonary neg pulmonary ROS,    Pulmonary exam normal breath sounds clear to auscultation       Cardiovascular negative cardio ROS Normal cardiovascular exam Rhythm:Regular Rate:Normal     Neuro/Psych negative neurological ROS  negative psych ROS   GI/Hepatic negative GI ROS, Neg liver ROS,   Endo/Other  negative endocrine ROS  Renal/GU negative Renal ROS  negative genitourinary   Musculoskeletal negative musculoskeletal ROS (+)   Abdominal   Peds negative pediatric ROS (+)  Hematology negative hematology ROS (+) hct 39.6, plt 177   Anesthesia Other Findings   Reproductive/Obstetrics (+) Pregnancy NRFHR in MAU, possibly ruptured for multiple days                             Anesthesia Physical Anesthesia Plan  ASA: 2  Anesthesia Plan: Spinal   Post-op Pain Management:    Induction:   PONV Risk Score and Plan: 2 and Ondansetron, Dexamethasone and Treatment may vary due to age or medical condition  Airway Management Planned: Natural Airway and Nasal Cannula  Additional Equipment: None  Intra-op Plan:   Post-operative Plan:   Informed Consent: I have reviewed the patients History and Physical, chart, labs and discussed the procedure including the risks, benefits and alternatives for the proposed anesthesia with the patient or authorized representative who has indicated his/her understanding and acceptance.       Plan Discussed with: CRNA  Anesthesia Plan Comments:        Anesthesia Quick Evaluation

## 2021-04-18 NOTE — MAU Provider Note (Signed)
Patient for RN labor eval, found to be grossly ruptured by RN. Patient awaitng bed for admission. Called Dr. Macon Large about [phone] to discuss non-reactive patient tracing. Patient has been repositioned multiple times and also given a fluid bolus followed by continuous fluid infusion without improvement in fetal tracing.  EFM: non-reactive       -baseline: 135       -variability: minimal       -accels: absent       -decels: noticeable drops in baseline after contractions, not meeting criteria for         late decelerations       -TOCO: irregular ctx  Per Dr. Lovett Sox, will take to OR for C/S as cannot labor patient, also requests UDS be performed.  Prepare for OR.  Marylen Ponto, NP  11:40 AM 04/18/2021

## 2021-04-18 NOTE — Anesthesia Procedure Notes (Signed)
Spinal  Patient location during procedure: OR Start time: 04/18/2021 12:23 PM End time: 04/18/2021 12:25 PM Reason for block: surgical anesthesia Staffing Performed: anesthesiologist  Anesthesiologist: Lannie Fields, DO Preanesthetic Checklist Completed: patient identified, IV checked, risks and benefits discussed, surgical consent, monitors and equipment checked, pre-op evaluation and timeout performed Spinal Block Patient position: sitting Prep: DuraPrep and site prepped and draped Patient monitoring: cardiac monitor, continuous pulse ox and blood pressure Approach: midline Location: L3-4 Injection technique: single-shot Needle Needle type: Pencan  Needle gauge: 24 G Needle length: 9 cm Assessment Sensory level: T6 Events: CSF return Additional Notes Functioning IV was confirmed and monitors were applied. Sterile prep and drape, including hand hygiene and sterile gloves were used. The patient was positioned and the spine was prepped. The skin was anesthetized with lidocaine.  Free flow of clear CSF was obtained prior to injecting local anesthetic into the CSF.  The spinal needle aspirated freely following injection.  The needle was carefully withdrawn.  The patient tolerated the procedure well.

## 2021-04-18 NOTE — Telephone Encounter (Signed)
Preadmission screen  

## 2021-04-18 NOTE — Transfer of Care (Signed)
Immediate Anesthesia Transfer of Care Note  Patient: Laura Moyer  Procedure(s) Performed: CESAREAN SECTION  Patient Location: PACU  Anesthesia Type:Spinal  Level of Consciousness: awake  Airway & Oxygen Therapy: Patient Spontanous Breathing  Post-op Assessment: Report given to RN and Post -op Vital signs reviewed and stable  Post vital signs: Reviewed and stable  Last Vitals:  Vitals Value Taken Time  BP 90/58 04/18/21 1333  Temp    Pulse 98 04/18/21 1335  Resp 16 04/18/21 1335  SpO2 100 % 04/18/21 1335  Vitals shown include unvalidated device data.  Last Pain:  Vitals:   04/18/21 0927  TempSrc: Oral  PainSc:          Complications: No notable events documented.

## 2021-04-18 NOTE — H&P (Addendum)
OBSTETRIC ADMISSION HISTORY AND PHYSICAL  Laura Moyer is a 25 y.o. female G2P0010 with IUP at [redacted]w[redacted]d by 9wk [place] presenting for SOL. Reports LOF. She reports +FMs, no VB, no blurry vision, headaches or peripheral edema, and RUQ pain.  She plans on breast feeding. She requests Depo vs POPs for birth control. She received her prenatal care at  [facility]    Dating: By 9wk [place] --->  Estimated Date of Delivery: 04/17/21  Sono:    @[redacted]w[redacted]d , CWD, normal anatomy, breech presentation, posterior placental lie, 329g, 95% EFW, fetal PACs seen after caffeine intake by patient  @[redacted]w[redacted]d , CWD, normal anatomy, cephalic presentation, posterior placental lie, 3585g, 61% EFW, no fetal arrhythmia seen  Prenatal History/Complications:  Fetal arrhythmia SROM for 1 week  Past Medical History: Past Medical History:  Diagnosis Date   Medical history non-contributory     Past Surgical History: Past Surgical History:  Procedure Laterality Date   NO PAST SURGERIES      Obstetrical History: OB History     Gravida  2   Para  0   Term  0   Preterm  0   AB  1   Living  0      SAB  1   IAB  0   Ectopic  0   Multiple  0   Live Births  0           Social History Social History   Socioeconomic History   Marital status: Single    Spouse name: Not on file   Number of children: Not on file   Years of education: Not on file   Highest education level: Not on file  Occupational History   Not on file  Tobacco Use   Smoking status: Never   Smokeless tobacco: Never  Vaping Use   Vaping Use: Never used  Substance and Sexual Activity   Alcohol use: Not Currently    Comment: occasionally   Drug use: Not Currently    Types: Marijuana   Sexual activity: Yes  Other Topics Concern   Not on file  Social History Narrative   Not on file   Social Determinants of Health   Financial Resource Strain: Not on file  Food Insecurity: No Food Insecurity   Worried About Running Out of Food in the Last  Year: Never true   Ran Out of Food in the Last Year: Never true  Transportation Needs: No Transportation Needs   Lack of Transportation (Medical): No   Lack of Transportation (Non-Medical): No  Physical Activity: Not on file  Stress: Not on file  Social Connections: Not on file    Family History: Family History  Problem Relation Age of Onset   Diabetes Mother    Diabetes Maternal Grandmother     Allergies: No Known Allergies  Medications Prior to Admission  Medication Sig Dispense Refill Last Dose   Prenatal Vit-Fe Fumarate-FA (PRENATAL MULTIVITAMIN) TABS tablet Take 1 tablet by mouth daily.        Review of Systems   All systems reviewed and negative except as stated in HPI  Blood pressure 135/87, pulse 92, temperature 98.1 F (36.7 C), temperature source Oral, resp. rate 20, height 5\' 1"  (1.549 m), weight 70.8 kg, last menstrual period 06/15/2020, SpO2 99 %, unknown if currently breastfeeding. General appearance: alert, cooperative, appears stated age, and no distress Lungs: clear to auscultation bilaterally Heart: regular rate and rhythm Abdomen: soft, non-tender; bowel sounds normal Pelvic: not examined Extremities: Homans sign  is negative, no sign of DVT DTR's 2+ Presentation: cephalic Fetal monitoringBaseline: 130 bpm, Variability: Absent, Accelerations: non-reactive, and Decelerations: Absent Uterine activityFrequency: Every 2-4 minutes Dilation: 4.5 Effacement (%): 90 Station: -2 Exam by:: n druebbisch rn   Prenatal labs: ABO, Rh: --/--/PENDING (08/09 1005) Antibody: PENDING (08/09 1005) Rubella: 4.27 (01/19 1012) RPR: Non Reactive (06/24 0840)  HBsAg: Negative (01/19 1012)  HIV: Non Reactive (06/24 0840)  GBS: Negative/-- (07/20 1106)  1 hr Glucola 97/80/108 Genetic screening  LR NIPS, LR [facility] US anatomy normal, fetal PACs seen at first [place] mother had caffeine, repeat US x2 without PACs.  Prenatal Transfer Tool  Maternal Diabetes:  No Genetic Screening: Normal Maternal Ultrasounds/Referrals: Other: fetal PACs Fetal Ultrasounds or other Referrals:  None Maternal Substance Abuse:  No Significant Maternal Medications:  None Significant Maternal Lab Results: Group B Strep negative  Results for orders placed or performed during the hospital encounter of 04/18/21 (from the past 24 hour(s))  Urinalysis, Routine w reflex microscopic Urine, Clean Catch   Collection Time: 04/18/21  9:29 AM  Result Value Ref Range   Color, Urine YELLOW YELLOW   APPearance CLEAR CLEAR   Specific Gravity, Urine 1.014 1.005 - 1.030   pH 7.0 5.0 - 8.0   Glucose, UA NEGATIVE NEGATIVE mg/dL   Hgb urine dipstick NEGATIVE NEGATIVE   Bilirubin Urine NEGATIVE NEGATIVE   Ketones, ur NEGATIVE NEGATIVE mg/dL   Protein, ur NEGATIVE NEGATIVE mg/dL   Nitrite NEGATIVE NEGATIVE   Leukocytes,Ua NEGATIVE NEGATIVE  Type and screen   Collection Time: 04/18/21 10:05 AM  Result Value Ref Range   ABO/RH(D) PENDING    Antibody Screen PENDING    Sample Expiration      04/21/2021,2359 Performed at [facility], [address], [place], [place] [address]   CBC   Collection Time: 04/18/21 10:10 AM  Result Value Ref Range   WBC 9.0 4.0 - 10.5 K/uL   RBC 4.06 3.87 - 5.11 MIL/uL   Hemoglobin 13.1 12.0 - 15.0 g/dL   HCT 46.6 59.9 - 35.7 %   MCV 97.5 80.0 - 100.0 fL   MCH 32.3 26.0 - 34.0 pg   MCHC 33.1 30.0 - 36.0 g/dL   RDW 01.7 79.3 - 90.3 %   Platelets 177 150 - 400 K/uL   nRBC 0.0 0.0 - 0.2 %    Patient Active Problem List   Diagnosis Date Noted   Supervision of low-risk pregnancy, first trimester 09/28/2020   Hyperemesis affecting pregnancy, antepartum 09/15/2020    Assessment/Plan:  Laura Moyer is a 25 y.o. G2P0010 at [redacted]w[redacted]d here for SROM.  #Labor: patient in labor, however due to persistently Cat II strip, will plan for cesarean delivery. Suspect that since patient likelyhad SROM since last Wednesday, infection could be present. Add  ancef and azithro for tx. #Elevated BP: Elevated BP to 146/82. CBC, CMP, urine PCR pending. Continue to monitor. Asx. #Pain: spinal #FWB: Cat II #ID:  GBS neg #MOF: breast #MOC: Depo vs POPs #Circ:  N/a  Shirlean Mylar, MD  04/18/2021, 11:06 AM

## 2021-04-18 NOTE — Anesthesia Postprocedure Evaluation (Signed)
Anesthesia Post Note  Patient: Laura Moyer  Procedure(s) Performed: CESAREAN SECTION     Patient location during evaluation: PACU Anesthesia Type: Spinal Level of consciousness: awake and alert and oriented Pain management: pain level controlled Vital Signs Assessment: post-procedure vital signs reviewed and stable Respiratory status: spontaneous breathing, nonlabored ventilation and respiratory function stable Cardiovascular status: blood pressure returned to baseline and stable Postop Assessment: no headache, no backache, spinal receding and patient able to bend at knees Anesthetic complications: no   No notable events documented.  Last Vitals:  Vitals:   04/18/21 1445 04/18/21 1500  BP: 121/78   Pulse: 89 89  Resp: 18 (!) 26  Temp: 36.5 C   SpO2: 96% 97%    Last Pain:  Vitals:   04/18/21 1445  TempSrc: Oral  PainSc:    Pain Goal:                Epidural/Spinal Function Cutaneous sensation: Able to Wiggle Toes (04/18/21 1445), Patient able to flex knees: Yes (04/18/21 1445), Patient able to lift hips off bed: No (04/18/21 1445), Back pain beyond tenderness at insertion site: No (04/18/21 1445), Progressively worsening motor and/or sensory loss: No (04/18/21 1445), Bowel and/or bladder incontinence post epidural: No (04/18/21 1445)  Lannie Fields

## 2021-04-18 NOTE — Op Note (Addendum)
Laura Moyer PROCEDURE DATE: 04/18/2021  PREOPERATIVE DIAGNOSES: Intrauterine pregnancy at [redacted]w[redacted]d weeks gestation; non-reassuring fetal status  POSTOPERATIVE DIAGNOSES: The same plus thick meconium  PROCEDURE: Primary Low Transverse Cesarean Section  SURGEON:  Dr. Milas Hock  ASSISTANT:  Dr. Bettey Mare  ANESTHESIOLOGY TEAM: Anesthesiologist: Lannie Fields, DO CRNA: Renford Dills, CRNA; Carlos American, CRNA  INDICATIONS: Laura Moyer is a 25 y.o. G2P0010 at [redacted]w[redacted]d here for cesarean section secondary to the indications listed under preoperative diagnoses; please see preoperative note for further details.  The risks of surgery were discussed with the patient including but were not limited to: bleeding which may require transfusion or reoperation; infection which may require antibiotics; injury to bowel, bladder, ureters or other surrounding organs; injury to the fetus; need for additional procedures including hysterectomy in the event of a life-threatening hemorrhage; formation of adhesions; placental abnormalities wth subsequent pregnancies; incisional problems; thromboembolic phenomenon and other postoperative/anesthesia complications.  The patient concurred with the proposed plan, giving informed written consent for the procedure.    FINDINGS:  Viable female infant in cephalic presentation.  Apgars 2 and 7.  Thick meconium.  Intact placenta with meconium staining, three vessel cord.  Normal uterus, fallopian tubes and ovaries bilaterally.  ANESTHESIA: Spinal INTRAVENOUS FLUIDS: 2000 ml   ESTIMATED BLOOD LOSS: 600 ml URINE OUTPUT:  200 ml SPECIMENS: Placenta sent to pathology, cord gas - arterial (pH 7.04) COMPLICATIONS: None immediate  PROCEDURE IN DETAIL:  The patient preoperatively received intravenous antibiotics and had sequential compression devices applied to her lower extremities.  She was then taken to the operating room where spinal anesthesia was found to  be adequate. She was then placed in a dorsal supine position with a leftward tilt, and prepped and draped in a sterile manner.  A foley catheter was placed into her bladder and attached to constant gravity.  After an adequate timeout was performed, a Pfannenstiel skin incision was made with scalpel and carried through to the underlying layer of fascia. The fascia was incised in the midline, and this incision was extended bluntly. The rectus muscles were separated in the midline and the peritoneum was entered bluntly.   The Alexis self-retaining retractor was introduced into the abdominal cavity.  Attention was turned to the lower uterine segment where a low transverse hysterotomy was made with a scalpel and extended bilaterally bluntly.  The infant was successfully delivered, the cord was clamped and cut, and the infant was handed over to the awaiting neonatology team. Uterine massage was then administered, and the placenta delivered intact with a three-vessel cord. The uterus was then cleared of clots and debris.  The hysterotomy was closed with 0 Vicryl in a running locked fashion, and an imbricating layer was also placed with 0 Vicryl.  2 Figure-of-eight 0 Vicryl serosal stitches were placed to help with hemostasis.    The pelvis was cleared of all clot and debris. Hemostasis was confirmed on all surfaces.  The retractor was removed.  The peritoneum was closed with a 2-0 Vicryl running stitch. The fascia was then closed using 0 PDS in a running fashion.  The subcutaneous layer was irrigated and hemostatic and the skin was closed with a 4-0 Vicryl subcuticular stitch. The patient tolerated the procedure well. Sponge, instrument and needle counts were correct x 3.  She was taken to the recovery room in stable condition.

## 2021-04-18 NOTE — Discharge Summary (Addendum)
Postpartum Discharge Summary  Date of Service updated 04/21/21     Patient Name: Laura Moyer DOB: Nov 03, 1995 MRN: [id number]  Date of admission: 04/18/2021 Delivery date:04/18/2021  Delivering provider: Radene Gunning  Date of discharge: 04/21/2021  Admitting diagnosis: Category II fetal heart rate tracing during labor and delivery [O76] Intrauterine pregnancy: [redacted]w[redacted]d    Secondary diagnosis:  Active Problems:   Supervision of low-risk pregnancy, first trimester   Category II fetal heart rate tracing during labor and delivery  Additional problems: Preeclampsia without severe features    Discharge diagnosis: Term Pregnancy Delivered                                              Post partum procedures: None  [facility] [place] [facility]   25y.o. yo G2P0010 at 425w4das admitted to the hospital 04/18/2021 for induction of labor. Patient had a labor course significant for PreE w/o severe features, pt SROM and came to L&D. She then began to have a nonreassuring cat 2 strip and c-section was decided. The patient went for cesarean section due to Non-Reassuring FHR. Delivery details are as follows: Membrane Rupture Time/Date: 12:42 PM ,04/18/2021   Delivery Method:C-Section, Low Transverse  Details of operation can be found in separate operative Note.  Patient had an uncomplicated postpartum course. She is ambulating, tolerating a regular diet, passing flatus, and urinating well.  Patient is discharged home in stable condition on 04/21/21.      Newborn Data: Birth date:04/18/2021  Birth time:12:43 PM  Gender:Female  Living status:Living  Apgars:2 ,7  Weight:3310 g                                Magnesium Sulfate received: No BMZ received: No Rhophylac:No MMR: N/A T-DaP:Given prenatally Flu: No Transfusion:No  Physical exam  Vitals:   04/20/21 0444 04/20/21 1305 04/20/21 2030 04/21/21 0605  BP: 121/76 117/75 121/72 121/75  Pulse:  84 85 89 82  Resp: _0 Temp: 98.4 F (36.9 C) 99 F (37.2 C) 98.3 F (36.8 C) 98 F (36.7 C)  TempSrc: Oral Oral Oral Oral  SpO2: 100% 100% 100% 100%  Weight:      Height:       General: alert, cooperative, and no distress Lochia: appropriate Uterine Fundus: firm Incision: Dressing is clean, dry, and intact DVT Evaluation: No evidence of DVT seen on physical exam. Labs: Lab Results  Component Value Date   WBC 15.0 (H) 04/19/2021   HGB 9.6 (L) 04/19/2021   HCT 28.5 (L) 04/19/2021   MCV 98.3 04/19/2021   PLT 143 (L) 04/19/2021   CMP Latest Ref Rng & Units 04/18/2021  Glucose 70 - 99 mg/dL -  BUN 6 - 20 mg/dL -  Creatinine 0.44 - 1.00 mg/dL 0.63  Sodium 135 - 145 mmol/L -  Potassium 3.5 - 5.1 mmol/L -  Chloride 98 - 111 mmol/L -  CO2 22 - 32 mmol/L -  Calcium 8.9 - 10.3 mg/dL -  Total Protein 6.5 - 8.1 g/dL -  Total Bilirubin 0.3 - 1.2 mg/dL -  Alkaline Phos 38 - 126 U/L -  AST 15 - 41 U/L -  ALT 0 - 44 U/L -   Edinburgh Score: EdFlavia Shipperostnatal  Depression Scale Screening Tool 04/19/2021  I have been able to laugh and see the funny side of things. 0  I have looked forward with enjoyment to things. 0  I have blamed myself unnecessarily when things went wrong. 1  I have been anxious or worried for no good reason. 1  I have felt scared or panicky for no good reason. 0  Things have been getting on top of me. 0  I have been so unhappy that I have had difficulty sleeping. 0  I have felt sad or miserable. 0  I have been so unhappy that I have been crying. 0  The thought of harming myself has occurred to me. 0  Edinburgh Postnatal Depression Scale Total 2     After visit meds:  Allergies as of 04/21/2021   No Known Allergies      Medication List     TAKE these medications    acetaminophen 325 MG tablet Commonly known as: Tylenol Take 2 tablets (650 mg total) by mouth every 6 (six) hours as needed.   coconut oil Oil Apply 1 application topically as  needed.   ferrous sulfate 325 (65 FE) MG tablet Take 1 tablet (325 mg total) by mouth every other day.   ibuprofen 200 MG tablet Commonly known as: Motrin IB Take 1 tablet (200 mg total) by mouth every 6 (six) hours as needed.   oxyCODONE 5 MG immediate release tablet Commonly known as: Oxy IR/ROXICODONE Take 1 tablet (5 mg total) by mouth every 4 (four) hours as needed for moderate pain.   prenatal multivitamin Tabs tablet Take 1 tablet by mouth daily.         Discharge home in stable condition Infant Feeding: Breast Infant Disposition:NICU Discharge instruction: per After Visit Summary and Postpartum booklet. Activity: Advance as tolerated. Pelvic rest for 6 weeks.  Diet: routine diet Future Appointments: No future appointments.  Follow up Visit:  Please schedule this patient for a In person postpartum visit in 1 week with the following provider: Any provider. Additional Postpartum F/U:Incision check and BP check in 1 week  High risk pregnancy complicated by:  PreE w/o SF Delivery mode:  C-Section, Low Transverse  Anticipated Birth Control:   Depo vs POPs   04/21/2021 Erskine Emery, MD

## 2021-04-18 NOTE — MAU Note (Signed)
Presents with c/o ctxs that began this morning @ 0400, now 5 minutes apart.  Also states been having LOF since last Wednesday.  Endorses +FM, less than usual.

## 2021-04-19 ENCOUNTER — Encounter: Payer: Medicaid Other | Admitting: Nurse Practitioner

## 2021-04-19 DIAGNOSIS — O4212 Full-term premature rupture of membranes, onset of labor more than 24 hours following rupture: Secondary | ICD-10-CM | POA: Diagnosis not present

## 2021-04-19 DIAGNOSIS — Z3A40 40 weeks gestation of pregnancy: Secondary | ICD-10-CM | POA: Diagnosis not present

## 2021-04-19 LAB — CBC
HCT: 28.5 % — ABNORMAL LOW (ref 36.0–46.0)
Hemoglobin: 9.6 g/dL — ABNORMAL LOW (ref 12.0–15.0)
MCH: 33.1 pg (ref 26.0–34.0)
MCHC: 33.7 g/dL (ref 30.0–36.0)
MCV: 98.3 fL (ref 80.0–100.0)
Platelets: 143 10*3/uL — ABNORMAL LOW (ref 150–400)
RBC: 2.9 MIL/uL — ABNORMAL LOW (ref 3.87–5.11)
RDW: 15 % (ref 11.5–15.5)
WBC: 15 10*3/uL — ABNORMAL HIGH (ref 4.0–10.5)
nRBC: 0 % (ref 0.0–0.2)

## 2021-04-19 LAB — KLEIHAUER-BETKE STAIN
# Vials RhIg: 1
Fetal Cells %: 0 %
Quantitation Fetal Hemoglobin: 0 mL

## 2021-04-19 MED ORDER — FERROUS SULFATE 325 (65 FE) MG PO TABS
325.0000 mg | ORAL_TABLET | ORAL | Status: DC
  Administered 2021-04-19 – 2021-04-21 (×2): 325 mg via ORAL
  Filled 2021-04-19 (×2): qty 1

## 2021-04-19 MED ORDER — ACETAMINOPHEN 500 MG PO TABS
1000.0000 mg | ORAL_TABLET | Freq: Four times a day (QID) | ORAL | Status: DC
  Administered 2021-04-19 – 2021-04-21 (×5): 1000 mg via ORAL
  Filled 2021-04-19 (×6): qty 2

## 2021-04-19 MED ORDER — ACETAMINOPHEN 500 MG PO TABS
1000.0000 mg | ORAL_TABLET | Freq: Four times a day (QID) | ORAL | Status: DC | PRN
  Filled 2021-04-19: qty 2

## 2021-04-19 NOTE — Lactation Note (Signed)
This note was copied from a baby's chart. Lactation Consultation Note LC attempted to consult with mother who was sleeping soundly. LC spoke with  Patient Name: Laura Moyer WPVXY'I Date: 04/19/2021 Reason for consult: NICU baby;Initial assessment (mother sleeping soundly) Age:27 hours  Maternal Data Has patient been taught Hand Expression?: Yes  Feeding Mother's Current Feeding Choice: Breast Milk  LATCH Score                    Lactation Tools Discussed/Used Tools: Pump Pump Education: Setup, frequency, and cleaning;Milk Storage (taught by RN) Reason for Pumping: NICU infant  Interventions    Discharge    Consult Status Consult Status: Follow-up Follow-up type: In-patient    Elder Negus 04/19/2021, 3:57 PM

## 2021-04-19 NOTE — Progress Notes (Addendum)
Post Operative Day 1 Subjective: no complaints, voiding, tolerating PO, and + flatus, normal lochia. Patient has not been able to ambulate since surgery. She says she will try to get up and move more this AM. Patient says she is a bit tender but is not having significant pain. Does not want to start birth control before discharge.  Objective: Blood pressure 109/63, pulse 67, temperature 98.6 F (37 C), temperature source Oral, resp. rate 18, height 5\' 1"  (1.549 m), weight 70.8 kg, last menstrual period 06/15/2020, SpO2 97 %, unknown if currently breastfeeding.  Physical Exam:  General: alert and no distress Lochia: appropriate Uterine Fundus: firm Incision: healing well, no significant drainage DVT Evaluation: No evidence of DVT seen on physical exam. No cords or calf tenderness. No significant calf/ankle edema.  Recent Labs    04/18/21 1010 04/19/21 0545  HGB 13.1 9.6*  HCT 39.6 28.5*    Assessment/Plan: -Patient meeting post-operative milestones, continue monitoring for ad lib ambulation -Patient is having catheter removed this AM, monitor for continued voiding. -Start 325mg  Iron Tablet for Hgb drop from 13.1>9.6 -BPs have been in normal range, monitoring BP postop for PreE w/o SF  -Plan for discharge tomorrow and Contraception declined   LOS: 1 day   Gema J Rodriguez-Teodoro 04/19/2021, 9:23 AM   Midwife attestation I have seen and examined this patient and agree with above documentation in the student's note.   Post Partum Day 1  Shantell Belongia is a 25 y.o. G2P0010 s/p CS.  Pt denies problems with ambulating, or po intake. She is due to void. Pain is well controlled. Method of Feeding: breast  PE:  Gen: well appearing Heart: reg rate Lungs: normal WOB Fundus firm Ext: soft, no pain, no edema  Assessment: S/p CS PPD #1 PEC: stable ABL anemia  Plan for discharge: tomorrow PO Fe   Rolena Infante, CNM 11:01 AM

## 2021-04-19 NOTE — Progress Notes (Signed)
Patient screened out for psychosocial assessment since none of the following apply:  Psychosocial stressors documented in mother or baby's chart  Gestation less than 32 weeks  Code at delivery   Infant with anomalies Please contact the Clinical Social Worker if specific needs arise, by MOB's request, or if MOB scores greater than 9/yes to question 10 on Edinburgh Postpartum Depression Screen.  Elvy Mclarty, LCSW Clinical Social Worker [facility] Cell#: [phone]     

## 2021-04-20 DIAGNOSIS — Z3A40 40 weeks gestation of pregnancy: Secondary | ICD-10-CM | POA: Diagnosis not present

## 2021-04-20 DIAGNOSIS — O4212 Full-term premature rupture of membranes, onset of labor more than 24 hours following rupture: Secondary | ICD-10-CM | POA: Diagnosis not present

## 2021-04-20 LAB — SURGICAL PATHOLOGY

## 2021-04-20 NOTE — Progress Notes (Signed)
POSTPARTUM PROGRESS NOTE  POD #2  Subjective:  Laura Moyer is a 25 y.o. G2P0010 s/p pLTCS at [redacted]w[redacted]d.  She reports she doing well. No acute events overnight. She denies any problems with ambulating, voiding or po intake. Denies nausea or vomiting. She has passed flatus. Pain is moderately controlled, has moments of poor control.  Lochia is mild.  Objective: Blood pressure 121/76, pulse 84, temperature 98.4 F (36.9 C), temperature source Oral, resp. rate 16, height 5\' 1"  (1.549 m), weight 70.8 kg, last menstrual period 06/15/2020, SpO2 100 %, unknown if currently breastfeeding.  Physical Exam:  General: alert, cooperative and no distress Chest: no respiratory distress Heart:regular rate, distal pulses intact Uterine Fundus: firm, appropriately tender DVT Evaluation: No calf swelling or tenderness Extremities: no edema Skin: warm, dry; incision clean/dry/intact w/ honeycomb dressing in place  Recent Labs    04/18/21 1010 04/19/21 0545  HGB 13.1 9.6*  HCT 39.6 28.5*    Assessment/Plan: Ravin Bendall is a 25 y.o. G2P0010 s/p pLTCS at [redacted]w[redacted]d for NRFHTs.  POD#2 - Doing overall well; pain poor/moderately controlled (aware of pain control options).   Routine postpartum care  OOB, ambulated  Lovenox for VTE prophylaxis Blood loss Anemia: asymptomatic  On ferrous sulfate   Contraception: considering depo vs POPs Feeding: Breast  Dispo: Patient is undecided on leaving today to allow further pain control. Instructed to let [redacted]w[redacted]d know if she decides to leave today, otherwise plan on discharge tomorrow. Infant in NICU.    LOS: 2 days   [place], DO  OB Fellow  04/20/2021, 8:06 AM

## 2021-04-20 NOTE — Lactation Note (Signed)
This note was copied from a baby's chart. Lactation Consultation Note  Patient Name: Laura Moyer CVELF'Y Date: 04/20/2021 Reason for consult: Initial assessment;Primapara;1st time breastfeeding;NICU baby;Term Age:27 hours  1335 - Lactation conducted an initial consult with [name]. Her RN had set up a DEBP, and she has initiated pumping. She already has several bottles containing EBM in her refrigerator. I helped her label those bottles and we reviewed storage guidelines.  Her pump pieces were in a basin soaking. I cleaned her pieces and reviewed proper cleaning guidelines.  I reviewed her pump settings and recommended that she switch to expression phase tomorrow due to good volumes today.  I educated on breast feeding and pumping basics and recommended that she pump 8 times a day for 15 minutes (both breasts) and be sure to pump at least 1-2 times at night.  I provided additional storage containers and cleaning supplies. I reviewed our contact information in case she has any questions or concerns.   Maternal Data Does the patient have breastfeeding experience prior to this delivery?: No  Feeding Mother's Current Feeding Choice: Breast Milk   Lactation Tools Discussed/Used Tools: Pump Breast pump type: Double-Electric Breast Pump;Other (comment) (friend is providing a new personal pump) Pump Education: Setup, frequency, and cleaning;Milk Storage Reason for Pumping: NICU Pumping frequency: 1 time/daily; recommended q 2-3 hours Pumped volume: 20 mL  Interventions Interventions: Breast feeding basics reviewed;DEBP;Education  Discharge    Consult Status Consult Status: Follow-up Date: 04/21/21 Follow-up type: In-patient    Walker Shadow 04/20/2021, 2:19 PM

## 2021-04-21 MED ORDER — FERROUS SULFATE 325 (65 FE) MG PO TABS: 325.0000 mg | ORAL_TABLET | ORAL | 3 refills | Status: DC

## 2021-04-21 MED ORDER — OXYCODONE HCL 5 MG PO TABS: 5.0000 mg | ORAL_TABLET | ORAL | 0 refills | Status: DC | PRN

## 2021-04-21 MED ORDER — COCONUT OIL OIL: 1.0000 "application " | TOPICAL_OIL | 0 refills | Status: DC | PRN

## 2021-04-21 MED ORDER — ACETAMINOPHEN 325 MG PO TABS: 650.0000 mg | ORAL_TABLET | Freq: Four times a day (QID) | ORAL | Status: DC | PRN

## 2021-04-21 MED ORDER — IBUPROFEN 200 MG PO TABS: 200.0000 mg | ORAL_TABLET | Freq: Four times a day (QID) | ORAL | 2 refills | Status: AC | PRN

## 2021-04-22 ENCOUNTER — Ambulatory Visit: Payer: Self-pay

## 2021-04-22 NOTE — Lactation Note (Signed)
This note was copied from a baby's chart. Lactation Consultation Note  Patient Name: [name] WPVXY'I Date: 04/22/2021   Age:27 days  Attempted a telephone consultation with mom since she didn't come back to the hospital but call got forwarded to her voicemail immediatly. Left contact information asking to call us back and to request NICU [facility] whenever she comes back to the hospital to update pumping status. Will F/U with mom the next time she comes in to visit baby.   Lakai Moree Venetia Constable 04/22/2021, 6:49 PM

## 2021-04-22 NOTE — Lactation Note (Signed)
This note was copied from a baby's chart. Lactation Consultation Note  Patient Name: Laura Moyer WLSLH'T Date: 04/22/2021   Age:27 days  Attempted to visit with mom but she hasn't arrived yet for the 2 pm feeding. RN Tresa Endo reported that FOB was here earlier in the AM and that he wasn't sure if mom would be coming in today, he said she was resting. NICU RN Tresa Endo to page [facility] if mom comes to the unit.  Laura Moyer S [name] 04/22/2021, 2:27 PM

## 2021-04-22 NOTE — Lactation Note (Signed)
This note was copied from a baby's chart. Lactation Consultation Note  Patient Name: Laura Moyer DVVOH'Y Date: 04/22/2021 Reason for consult: Follow-up assessment;Primapara;1st time breastfeeding;NICU baby;Term Age:27 days  Mom came to the hospital to visit baby, she complained of discomfort and low output on her right breast and was concerned about engorgement. Right breast feels full but not engorged upon examination, some swelling note. Left breast feels softer and mom voiced that is the one where she gets the most milk, it flows much easily on that breast.  LC got ice packs for mom and instructed her to use in case her right breast feels uncomfortable. Reviewed pumping schedule, lactogenesis II and pump settings, she'll stop using the let down button now that her milk is in. Baby is currently intubated and on NPO, mom is only working on the pumping so far.  Plan of care:  Encouraged mom to continue pumping every 2-3 hours, at least 8 pumping sessions/24 hours She'll apply ice to affected breast as needed  Mom and female visitor present. Family reported all questions and concerns were answered, they're both aware of NICU LC services and will call PRN.  Maternal Data   Mom's supply is WNL, her milk is in and sometimes she feels like she needs to pump every 2 hours instead of 3  Feeding Mother's Current Feeding Choice: Breast Milk  Lactation Tools Discussed/Used Tools: Pump Breast pump type: Double-Electric Breast Pump Pump Education: Setup, frequency, and cleaning;Milk Storage Reason for Pumping: NICU infant Pumping frequency: q 3 hours Pumped volume: 150 mL (150-180 ml)  Interventions Interventions: Breast feeding basics reviewed;Ice;DEBP;Education;Hand express;Breast massage  Discharge Discharge Education: Engorgement and breast care Pump: DEBP;Personal  Consult Status Consult Status: Follow-up Follow-up type: In-patient    Lenin Kuhnle Venetia Constable 04/22/2021, 7:30  PM

## 2021-04-23 ENCOUNTER — Ambulatory Visit: Payer: Self-pay

## 2021-04-23 NOTE — Lactation Note (Addendum)
This note was copied from a baby's chart. Lactation Consultation Note Mother is pumping very frequently because of breast fullness and abundant milk volume. No engorgement today.I encouraged her to continue to pump frequently to avoid engorgement. I reviewed normalcy of over producing in first couple of weeks. Milk volume will likely regulate in the 2nd week pp.   1800: LC returned to room at RN's request. Mother is experiencing increasing fullness, especially in L breast. She is alternating ice, massage/heat, and pumping. Milk continues to flow but she appears to be at risk for engorgement this evening.   POC: Continue to pump q 2 and ice / massage breasts between pumpings. Can use some warm moist heat while pumping. Also consider Ibuprofen to reduce inflammation and use symphony pump while in NICU.   LC will reassess in the morning.    Patient Name: Laura Moyer IHWTU'U Date: 04/23/2021 Reason for consult: Follow-up assessment Age:85 days  Maternal Data  Pumping frequency: q2 / per pumping  Feeding Mother's Current Feeding Choice: Breast Milk  Interventions Interventions: Education  Discharge Discharge Education: Engorgement and breast care  Consult Status Consult Status: Follow-up Follow-up type: In-patient   Elder Negus, MA IBCLC 04/23/2021, 2:05 PM

## 2021-04-24 ENCOUNTER — Ambulatory Visit: Payer: Self-pay

## 2021-04-24 ENCOUNTER — Telehealth: Payer: Self-pay

## 2021-04-24 ENCOUNTER — Inpatient Hospital Stay (HOSPITAL_COMMUNITY): Payer: Medicaid Other

## 2021-04-24 ENCOUNTER — Inpatient Hospital Stay (HOSPITAL_COMMUNITY)
Admission: AD | Admit: 2021-04-24 | Payer: Medicaid Other | Source: Home / Self Care | Admitting: Obstetrics & Gynecology

## 2021-04-24 NOTE — Telephone Encounter (Signed)
Transition Care Management Unsuccessful Follow-up Telephone Call  Date of discharge and from where:  04/21/2021-[facility]  Attempts:  1st Attempt  Reason for unsuccessful TCM follow-up call:  Left voice message    

## 2021-04-24 NOTE — Lactation Note (Signed)
This note was copied from a baby's chart. Lactation Consultation Note LC paged to room. Mother with continued L breast engorgement but with improvement since last night. I provided ice packs and reviewed need for massage and frequent pump use.   Patient Name: Girl Myrel Rappleye VVOHY'W Date: 04/24/2021   Age:27 days  Maternal Data  in past 24 hours   Interventions  Ice packs, education    Elder Negus, [place] IBCLC 04/24/2021, 5:11 PM

## 2021-04-25 NOTE — Telephone Encounter (Signed)
Transition Care Management Unsuccessful Follow-up Telephone Call  Date of discharge and from where:  04/21/2021-[facility]  Attempts:  2nd Attempt  Reason for unsuccessful TCM follow-up call:  Left voice message

## 2021-04-26 NOTE — Telephone Encounter (Signed)
Transition Care Management Unsuccessful Follow-up Telephone Call  Date of discharge and from where:  04/21/2021-[facility]   Attempts:  3rd Attempt  Reason for unsuccessful TCM follow-up call:  Left voice message

## 2021-04-28 ENCOUNTER — Ambulatory Visit (INDEPENDENT_AMBULATORY_CARE_PROVIDER_SITE_OTHER): Payer: Medicaid Other

## 2021-04-28 ENCOUNTER — Other Ambulatory Visit: Payer: Self-pay

## 2021-04-28 ENCOUNTER — Ambulatory Visit: Payer: Self-pay

## 2021-04-28 VITALS — BP 123/83 | HR 84 | Wt 139.9 lb

## 2021-04-28 DIAGNOSIS — Z0130 Encounter for examination of blood pressure without abnormal findings: Secondary | ICD-10-CM

## 2021-04-28 DIAGNOSIS — Z5189 Encounter for other specified aftercare: Secondary | ICD-10-CM

## 2021-04-28 NOTE — Progress Notes (Signed)
Incision/Blood Pressure Check  Here today following primary c-section on 04/18/21. Pt had multiple elevated BP during admission. No prior history of elevated BP. Not discharged with any BP medication. BP today is 123/83. Reviewed with Eino Farber, CNM who states this is WNL and pt may follow up as planned.   Area surrounding incision is dry and intact; adhesive from honeycomb dressing still present, cleaned with adhesive remover. 8 steri strips minimally soiled are covering incision. Edges of incision that are visible appear clean, dry, and intact. Pt reports intermittent pain at incision site 5/10; none at this time. Reports good BM each day. Taking only ibuprofen for pain control. Reviewed s/s of infection and good wound care.  Baby is in NICU. Patient is pumping, reports this is going well.   Fleet Contras RN 04/28/21

## 2021-04-28 NOTE — Lactation Note (Signed)
This note was copied from a baby's chart. Lactation Consultation Note  Patient Name: Girl Apple Dearmas HQION'G Date: 04/28/2021 Reason for consult: Follow-up assessment;NICU baby;Term Age:25 years  Visited with mom of 27 days old FT NICU female, she reported that engorgement has subsided and is now gone, she's been applying ice packs and warm compresses; she has also been pumping consistently.   Mom is just waiting for baby to get off his O2 to do some STS and lick & learn. Asked mom to let baby's NICU RN know when she's ready to go to breast for a feeding assist.  Reviewed pumping schedule, supply/demand and breast care. LC brought additional ice packs and bottles to mom per her request.  Plan of care:   Encouraged mom to continue pumping every 2-3 hours, at least 8 pumping sessions/24 hours She'll use ice packs as needed   No other support person at this time. Mom reported all questions and concerns were answered, she's aware of NICU LC services and will call PRN.    Maternal Data   Mom's milk supply is ANL  Feeding Mother's Current Feeding Choice: Breast Milk  Lactation Tools Discussed/Used Tools: Pump;Flanges Flange Size: 24 Breast pump type: Double-Electric Breast Pump Pump Education: Setup, frequency, and cleaning;Milk Storage Reason for Pumping: NICU infant Pumping frequency: q 3 hours Pumped volume: 300 mL  Interventions    Discharge Discharge Education: Engorgement and breast care Pump: DEBP;Personal  Consult Status Consult Status: Follow-up Follow-up type: In-patient    Alexsis Kathman Venetia Constable 04/28/2021, 11:51 AM

## 2021-05-01 ENCOUNTER — Telehealth (HOSPITAL_COMMUNITY): Payer: Self-pay | Admitting: *Deleted

## 2021-05-01 NOTE — Telephone Encounter (Signed)
Left message to return nurse call.  Duffy Rhody, RN 05-01-2021 at 1:52pm

## 2021-05-03 ENCOUNTER — Ambulatory Visit: Payer: Self-pay

## 2021-05-03 NOTE — Lactation Note (Signed)
This note was copied from a baby's chart. Lactation Consultation Note LC paged to room to assist. Baby at breast with frequent but unsustained suckling bursts. Mother pumped 2 hours ago, however; she has a significant oversupply. Several audible swallows heard, likely without a lot of effort required by baby. Slight improvements made in positioning to support mom and baby. Will plan f/u to further assist prn.   Mother should continue to pump prior to bf'ing.   Patient Name: Laura Moyer KGSUP'[id number] Date: 05/03/2021 Reason for consult: Follow-up assessment (breastfeeding assistance) Age:27 wk.o.  Maternal Data  Pumping frequency: q3 Pumped volume: 480 mL  Feeding Mother's Current Feeding Choice: Breast Milk Nipple Type: Nfant Extra Slow Flow (gold)  LATCH Score Latch: Repeated attempts needed to sustain latch, nipple held in mouth throughout feeding, stimulation needed to elicit sucking reflex.  Audible Swallowing: A few with stimulation  Type of Nipple: Everted at rest and after stimulation  Comfort (Breast/Nipple): Soft / non-tender  Hold (Positioning): Assistance needed to correctly position infant at breast and maintain latch.  LATCH Score: 7  Interventions Interventions: Breast feeding basics reviewed;Position options;Assisted with latch;Education;Support pillows   Consult Status Consult Status: Follow-up Follow-up type: In-patient   Elder Negus, MA IBCLC 05/03/2021, 3:46 PM

## 2021-05-14 ENCOUNTER — Ambulatory Visit: Payer: Self-pay

## 2021-05-14 NOTE — Lactation Note (Addendum)
This note was copied from a baby's chart. Lactation Consultation Note  Patient Name: Laura Moyer INOMV'E Date: 05/14/2021 Reason for consult: Follow-up assessment;NICU baby;Term Age:27 wk.o.  Visited with mom of 2 weeks old FT NICU female, she was giving baby a bottle with breastmilk when entered the room. Mom reports that pumping is going well, but that she sometimes still get a plugged duct from time to time, but it's nothing major.  She normally massages it and it goes away. Reviewed engorgement/plugged ducts prevention and treatment, pumping schedule and power pumping.  Plan of care:   Encouraged mom to continue pumping every 2-3 hours, at least 8 pumping sessions/24 hours She'll use cold/heat and massage therapy as needed She has an appt tomorrow at 3 pm for feeding assist.   No other support person at this time. Mom reported all questions and concerns were answered, she's aware of NICU LC services and will call PRN.  Maternal Data   Mom's supply is WNL; she reports it's finally under control and she's not longer getting engorged  Feeding Mother's Current Feeding Choice: Breast Milk  Lactation Tools Discussed/Used Tools: Pump;Flanges Flange Size: 24 Breast pump type: Double-Electric Breast Pump Pump Education: Setup, frequency, and cleaning;Milk Storage Reason for Pumping: NICU infant Pumping frequency: 6-7 times/24 hours Pumped volume: 270 mL  Interventions Interventions: Breast feeding basics reviewed;DEBP;Education  Discharge Pump: DEBP;Personal  Consult Status Consult Status: Follow-up Date: 05/14/21 Follow-up type: In-patient   Andra Matsuo Venetia Constable 05/14/2021, 4:05 PM

## 2021-05-22 ENCOUNTER — Ambulatory Visit: Payer: Self-pay

## 2021-05-22 NOTE — Lactation Note (Signed)
This note was copied from a baby's chart. Lactation Consultation Note Mother continues to pump frequently and with abundant supply. She missed a pumping session today and was experiencing discomfort. LC provided heat packs while mom was pumping.   Patient Name: Laura Moyer KHVFM'B Date: 05/22/2021 Reason for consult: Follow-up assessment Age:27 wk.o.  Maternal Data  Pumping frequency: q4 Pumped volume: 450 mL  Feeding Mother's Current Feeding Choice: Breast Milk  Interventions Interventions: Education  Consult Status Consult Status: Follow-up Date: 05/22/21 Follow-up type: In-patient   Elder Negus 05/22/2021, 5:48 PM

## 2021-05-23 ENCOUNTER — Ambulatory Visit: Payer: Self-pay

## 2021-05-23 NOTE — Lactation Note (Signed)
This note was copied from a baby's chart. Lactation Consultation Note  Patient Name: Laura Moyer FGHWE'X Date: 05/23/2021 Reason for consult: Follow-up assessment;NICU baby;Term Age:27 wk.o.  LC and LC student [place] visited with mom of 70 weeks old NICU female, baby is going home today. Reviewed discharge instructions, pumping schedule and feeding cues. Explained to mom the importance of consistent pumping after feedings at the breast to protect her supply.  Mom plans to take baby to breast in addition to pumping, LC OP recommendation was made, mom will contact if she feels she needs assistance with BF post-discharge.  Plan of care:   Encouraged mom to continue pumping every 2-3 hours, at least 8 pumping sessions/24 hours She'll power pump in the AM in case she misses a pumping session at night She will start taking baby to breast based on feeding cues and readiness   FOB present. Parents reported all questions and concerns were answered, they're both aware of NICU LC services and will call PRN.  Maternal Data   Mom's supply is WNL  Feeding Mother's Current Feeding Choice: Breast Milk and Formula Nipple Type: Dr. Irving Burton Preemie (wide base)  Lactation Tools Discussed/Used Tools: Pump;Flanges Flange Size: 24 Breast pump type: Double-Electric Breast Pump Pump Education: Setup, frequency, and cleaning;Milk Storage Reason for Pumping: NICU infant Pumping frequency: 6 times Pumped volume: 210 mL  Interventions Interventions: Breast feeding basics reviewed;DEBP;Education  Discharge Pump: DEBP;Personal (DEBP at home)  Consult Status Consult Status: Complete Date: 05/23/21 Follow-up type: Call as needed  Laura Moyer 05/23/2021, 6:21 PM

## 2021-05-31 ENCOUNTER — Ambulatory Visit (INDEPENDENT_AMBULATORY_CARE_PROVIDER_SITE_OTHER): Payer: Medicaid Other | Admitting: Family Medicine

## 2021-05-31 ENCOUNTER — Other Ambulatory Visit: Payer: Self-pay

## 2021-05-31 ENCOUNTER — Encounter: Payer: Self-pay | Admitting: Family Medicine

## 2021-05-31 DIAGNOSIS — Z3009 Encounter for other general counseling and advice on contraception: Secondary | ICD-10-CM

## 2021-05-31 MED ORDER — ELLA 30 MG PO TABS: 1.0000 | ORAL_TABLET | Freq: Once | ORAL | 0 refills | Status: AC

## 2021-05-31 NOTE — Progress Notes (Signed)
Post Partum Visit Note  Laura Moyer is a 25 y.o. G76P0010 female who presents for a postpartum visit. She is 6 weeks postpartum following a primary cesarean section.  I have fully reviewed the prenatal and intrapartum course. The delivery was at 40.1 gestational weeks.  Anesthesia: spinal. Postpartum course has been uncomplicated . Baby is doing well. Baby is feeding by breast. Bleeding no bleeding. Bowel function is normal. Bladder function is normal. Patient is not sexually active. Contraception method is none. Considering depo but would like to wait. Would like emergency contraception prescription just in case Postpartum depression screening: negative.   The pregnancy intention screening data noted above was reviewed. Potential methods of contraception were discussed. The patient elected to wait before getting on contraception.    Edinburgh Postnatal Depression Scale - 05/31/21 1404       Edinburgh Postnatal Depression Scale:  In the Past 7 Days   I have been able to laugh and see the funny side of things. 0    I have looked forward with enjoyment to things. 0    I have blamed myself unnecessarily when things went wrong. 0    I have been anxious or worried for no good reason. 0    I have felt scared or panicky for no good reason. 0    Things have been getting on top of me. 0    I have been so unhappy that I have had difficulty sleeping. 0    I have felt sad or miserable. 1    I have been so unhappy that I have been crying. 1    The thought of harming myself has occurred to me. 0    Edinburgh Postnatal Depression Scale Total 2             Health Maintenance Due  Topic Date Due   COVID-19 Vaccine (1) Never done   INFLUENZA VACCINE  Never done    The following portions of the patient's history were reviewed and updated as appropriate: allergies, current medications, past family history, past medical history, past social history, past surgical history, and problem  list.  Review of Systems Pertinent items are noted in HPI.  Objective:  BP 108/69   Pulse 65   Wt 133 lb 4.8 oz (60.5 kg)   LMP 06/15/2020   Breastfeeding Unknown   BMI 25.19 kg/m    General:  alert   Breasts:  not indicated  Lungs: Breathing comfortably on room air  Heart:  Extremities warm  Abdomen: Non tender    Wound well approximated incision  GU exam:  not indicated       Assessment:   Post partum visit Meeting milestones appropriately. BP normotensive today. Had pre-E without severe features intrapartum but had normal BP in post partum and not on medications. See other details below.  2. Contraception counseling Discussed contraception options in detail. Patient considering depo but not ready to get it today. Discussed possibility of getting pregnant even while breastfeeding. Patient expressed understanding. Would like emergency contraception in case needs it Samson Frederic sent to pharmacy - Patient will call to schedule RN visit for Depo when ready  Plan:   Essential components of care per ACOG recommendations:  1.  Mood and well being: Patient with negative depression screening today. Reviewed local resources for support.  - Patient tobacco use? No.   - hx of drug use? No.    2. Infant care and feeding:  -Patient currently breastmilk feeding? Yes.  Reviewed importance of draining breast regularly to support lactation.  -Social determinants of health (SDOH) reviewed in EPIC. No concerns  3. Sexuality, contraception and birth spacing - Patient does not want a pregnancy in the next year.   - Reviewed forms of contraception in tiered fashion. Patient desired no method today.   - Discussed birth spacing of 18 months  4. Sleep and fatigue -Encouraged family/partner/community support of 4 hrs of uninterrupted sleep to help with mood and fatigue  5. Physical Recovery  - Discussed patients delivery and complications. She describes her labor as good. - Patient had a  C-section for nrFHT, no problems at delivery. Perineal healing reviewed. Patient expressed understanding - Patient has urinary incontinence? No. - Patient is safe to resume physical and sexual activity  6.  Health Maintenance - HM due items addressed Yes - Last pap smear  Diagnosis  Date Value Ref Range Status  09/28/2020   Final   - Negative for intraepithelial lesion or malignancy (NILM)   Pap smear not done at today's visit.  -Breast Cancer screening indicated? No.   7. Chronic Disease/Pregnancy Condition follow up: None  - PCP follow up  Warner Mccreedy, MD, MPH OB Fellow, Faculty Practice Center for [facility], [facility]

## 2021-08-07 ENCOUNTER — Ambulatory Visit (INDEPENDENT_AMBULATORY_CARE_PROVIDER_SITE_OTHER): Payer: Medicaid Other | Admitting: Family Medicine

## 2021-08-07 ENCOUNTER — Other Ambulatory Visit: Payer: Self-pay

## 2021-08-07 ENCOUNTER — Other Ambulatory Visit (HOSPITAL_COMMUNITY)
Admission: RE | Admit: 2021-08-07 | Discharge: 2021-08-07 | Disposition: A | Discharge status: Home / Self Care | Payer: Medicaid Other | Source: Ambulatory Visit | Attending: Family Medicine | Admitting: Family Medicine

## 2021-08-07 ENCOUNTER — Encounter: Payer: Self-pay | Admitting: Family Medicine

## 2021-08-07 VITALS — BP 116/77 | HR 81 | Wt 137.5 lb

## 2021-08-07 DIAGNOSIS — Z98891 History of uterine scar from previous surgery: Secondary | ICD-10-CM | POA: Diagnosis not present

## 2021-08-07 DIAGNOSIS — F53 Postpartum depression: Secondary | ICD-10-CM

## 2021-08-07 DIAGNOSIS — Z124 Encounter for screening for malignant neoplasm of cervix: Secondary | ICD-10-CM | POA: Diagnosis not present

## 2021-08-07 DIAGNOSIS — Z01419 Encounter for gynecological examination (general) (routine) without abnormal findings: Secondary | ICD-10-CM

## 2021-08-07 HISTORY — DX: Postpartum depression: F53.0

## 2021-08-07 NOTE — Assessment & Plan Note (Signed)
The baby is having a lot of issues, and this is wearing on her. Return if needs meds. Would like to see IBH and possible referral for on-going therapy if needed.

## 2021-08-07 NOTE — Progress Notes (Signed)
Subjective:     Laura Moyer is a 25 y.o. female and is here for a comprehensive physical exam. The patient reports no problems.   The following portions of the patient's history were reviewed and updated as appropriate: allergies, current medications, past family history, past medical history, past social history, past surgical history, and problem list.  Review of Systems Pertinent items noted in HPI and remainder of comprehensive ROS otherwise negative.   Objective:    BP 116/77   Pulse 81   Wt 137 lb 8 oz (62.4 kg)   LMP 07/10/2021 (Approximate)   SpO2 99%   Breastfeeding Yes   BMI 25.98 kg/m  General appearance: alert, cooperative, and appears stated age Head: Normocephalic, without obvious abnormality, atraumatic Neck: no adenopathy, supple, symmetrical, trachea midline, and thyroid not enlarged, symmetric, no tenderness/mass/nodules Lungs: clear to auscultation bilaterally Heart: regular rate and rhythm, S1, S2 normal, no murmur, click, rub or gallop Abdomen: soft, non-tender; bowel sounds normal; no masses,  no organomegaly Pelvic: cervix normal in appearance, external genitalia normal, no adnexal masses or tenderness, no cervical motion tenderness, uterus normal size, shape, and consistency, and vagina normal without discharge Extremities: extremities normal, atraumatic, no cyanosis or edema Pulses: 2+ and symmetric Skin: Skin color, texture, turgor normal. No rashes or lesions Lymph nodes: Cervical, supraclavicular, and axillary nodes normal. Neurologic: Grossly normal    Assessment:    GYN female exam.      Plan:   Problem List Items Addressed This Visit       Unprioritized   Postpartum depression    The baby is having a lot of issues, and this is wearing on her. Return if needs meds. Would like to see IBH and possible referral for on-going therapy if needed.      Relevant Orders   Ambulatory referral to Integrated Behavioral Health   History of  cesarean delivery   Other Visit Diagnoses     Screening for malignant neoplasm of cervix    -  Primary   Relevant Orders   Cytology - PAP( Loop)   Encounter for gynecological examination without abnormal finding       declined flu shot today      Return in 1 year (on 08/07/2022).    See After Visit Summary for Counseling Recommendations

## 2021-08-07 NOTE — Patient Instructions (Signed)

## 2021-08-08 LAB — CYTOLOGY - PAP
Chlamydia: NEGATIVE
Comment: NEGATIVE
Comment: NEGATIVE
Comment: NORMAL
Diagnosis: NEGATIVE
Neisseria Gonorrhea: NEGATIVE
Trichomonas: NEGATIVE

## 2021-08-18 NOTE — BH Specialist Note (Signed)
Integrated Behavioral Health via Telemedicine Visit  08/18/2021 Laura Moyer [id number]  Number of Integrated Behavioral Health visits: 1 Session Start time: 9:46  Session End time: 10:47 Total time:  40  Referring Provider: Tinnie Gens, MD Patient/Family location: Home [facility] Provider location: [facility]  All persons participating in visit: Patient Laura Moyer and [facility] Dvon Jiles   Types of Service: Individual psychotherapy and Video visit  I connected with Laura Moyer and/or Laura Moyer  n/a  via  Telephone or Video Enabled Telemedicine Application  (Video is Caregility application) and verified that I am speaking with the correct person using two identifiers. Discussed confidentiality: Yes   I discussed the limitations of telemedicine and the availability of in person appointments.  Discussed there is a possibility of technology failure and discussed alternative modes of communication if that failure occurs.  I discussed that engaging in this telemedicine visit, they consent to the provision of behavioral healthcare and the services will be billed under their insurance.  Patient and/or legal guardian expressed understanding and consented to Telemedicine visit: Yes   Presenting Concerns: Patient and/or family reports the following symptoms/concerns: Adjusting to new motherhood with baby's difficult health condition; baby was in NICU for 45 days, unable to hold her for over two weeks. Pt is worried about not producing enough milk with pumping, would like referral to lactation consultant. Duration of problem: 4 months; Severity of problem: moderate  Patient and/or Family's Strengths/Protective Factors: Social connections, Concrete supports in place (healthy food, safe environments, etc.), Sense of purpose, and Physical Health (exercise, healthy diet, medication compliance, etc.)  Goals Addressed: Patient  will:  Reduce symptoms of: stress   Increase knowledge and/or ability of: stress reduction   Demonstrate ability to: Increase healthy adjustment to current life circumstances and Increase adequate support systems for patient/family  Progress towards Goals: Ongoing  Interventions: Interventions utilized:  Solution-Focused Strategies Standardized Assessments completed: Not Needed  Patient and/or Family Response: Pt agrees with treatment plan  Assessment: Patient currently experiencing Adjustment disorder with other symptom.   Patient may benefit from psychoeducation and brief therapeutic interventions regarding coping with symptoms of stress .  Plan: Follow up with behavioral health clinician on : Three weeks Behavioral recommendations:  -Accept referral to lactation consultant -Consider registering for and attending new mom support group of choice at [email or web address] Referral(s): Integrated Art gallery manager (In Clinic) and [employer]:  new mom online support groups  I discussed the assessment and treatment plan with the patient and/or parent/guardian. They were provided an opportunity to ask questions and all were answered. They agreed with the plan and demonstrated an understanding of the instructions.   They were advised to call back or seek an in-person evaluation if the symptoms worsen or if the condition fails to improve as anticipated.  Rae Lips, LCSW  Depression screen [facility] 2/9 08/07/2021 05/31/2021 04/11/2021 04/05/2021 03/29/2021  Decreased Interest 0 0 0 0 0  Down, Depressed, Hopeless 0 0 0 0 0  PHQ - 2 Score 0 0 0 0 0  Altered sleeping 0 0 0 0 0  Tired, decreased energy 0 0 0 0 0  Change in appetite 0 0 0 0 0  Feeling bad or failure about yourself  0 0 0 0 0  Trouble concentrating 0 0 0 0 0  Moving slowly or fidgety/restless 0 0 0 0 0  Suicidal thoughts 0 0 0 0 0  PHQ-9 Score 0 0 0 0  0   GAD 7 : Generalized Anxiety Score 08/07/2021  05/31/2021 04/11/2021 04/05/2021  Nervous, Anxious, on Edge 1 0 0 0  Control/stop worrying 1 0 0 0  Worry too much - different things 1 0 0 0  Trouble relaxing 0 0 0 0  Restless 0 0 0 0  Easily annoyed or irritable 0 0 0 0  Afraid - awful might happen 0 0 0 0  Total GAD 7 Score 3 0 0 0    Edinburgh Postnatal Depression Scale Screening Tool 05/31/2021 04/28/2021 04/19/2021 04/19/2021 04/18/2021  I have been able to laugh and see the funny side of things. 0 0 0 (No Data) (No Data)  I have looked forward with enjoyment to things. 0 0 0 - -  I have blamed myself unnecessarily when things went wrong. 0 1 1 - -  I have been anxious or worried for no good reason. 0 1 1 - -  I have felt scared or panicky for no good reason. 0 0 0 - -  Things have been getting on top of me. 0 2 0 - -  I have been so unhappy that I have had difficulty sleeping. 0 0 0 - -  I have felt sad or miserable. 1 1 0 - -  I have been so unhappy that I have been crying. 1 1 0 - -  The thought of harming myself has occurred to me. 0 0 0 - -  Edinburgh Postnatal Depression Scale Total 2 6 2  - -

## 2021-09-01 ENCOUNTER — Ambulatory Visit: Payer: Medicaid Other | Admitting: Clinical

## 2021-09-01 DIAGNOSIS — F4329 Adjustment disorder with other symptoms: Secondary | ICD-10-CM

## 2021-09-01 NOTE — Patient Instructions (Signed)
Center for [facility] at [facility] [address] [place], [place] [address] [phone] (main office) [phone] (Jakaree Pickard's office)  [email or web address]

## 2021-09-08 NOTE — BH Specialist Note (Deleted)
Integrated Behavioral Health via Telemedicine Visit  09/08/2021 Laura Moyer [id number]  Number of Integrated Behavioral Health visits: *** Session Start time: 10:15***  Session End time: 10:45*** Total time: {IBH Total Time:21014050}  Referring Provider: *** Patient/Family location: Home*** [facility] Provider location: [facility]  All persons participating in visit: Patient *** and [facility] Laura Moyer ***  Types of Service: {CHL AMB TYPE OF [phone]}  I connected with Laura Moyer and/or Laura Moyer's {family members:20773} via  Telephone or Engineer, civil (consulting)  (Video is Caregility application) and verified that I am speaking with the correct person using two identifiers. Discussed confidentiality: {YES/NO:21197}  I discussed the limitations of telemedicine and the availability of in person appointments.  Discussed there is a possibility of technology failure and discussed alternative modes of communication if that failure occurs.  I discussed that engaging in this telemedicine visit, they consent to the provision of behavioral healthcare and the services will be billed under their insurance.  Patient and/or legal guardian expressed understanding and consented to Telemedicine visit: {YES/NO:21197}  Presenting Concerns: Patient and/or family reports the following symptoms/concerns: *** Duration of problem: ***; Severity of problem: {Mild/Moderate/Severe:20260}  Patient and/or Family's Strengths/Protective Factors: {CHL AMB BH PROTECTIVE FACTORS:[phone]}  Goals Addressed: Patient will:  Reduce symptoms of: {IBH Symptoms:21014056}   Increase knowledge and/or ability of: {IBH Patient Tools:21014057}   Demonstrate ability to: {IBH Goals:21014053}  Progress towards Goals: {CHL AMB BH PROGRESS TOWARDS GOALS:[phone]}  Interventions: Interventions utilized:  {IBH  Interventions:21014054} Standardized Assessments completed: {IBH Screening Tools:21014051}  Patient and/or Family Response: ***  Assessment: Patient currently experiencing ***.   Patient may benefit from ***.  Plan: Follow up with behavioral health clinician on : *** Behavioral recommendations: *** Referral(s): {IBH Referrals:21014055}  I discussed the assessment and treatment plan with the patient and/or parent/guardian. They were provided an opportunity to ask questions and all were answered. They agreed with the plan and demonstrated an understanding of the instructions.   They were advised to call back or seek an in-person evaluation if the symptoms worsen or if the condition fails to improve as anticipated.  Valetta Close Monserrat Vidaurri, LCSW

## 2021-09-14 NOTE — BH Specialist Note (Signed)
Pt did not arrive to video visit and did not answer the phone; Left HIPPA-compliant message to call back Annelle Behrendt from [facility] at  [phone] (Philander Ake's office).  ?; left MyChart message for patient.  ? ?

## 2021-09-25 ENCOUNTER — Ambulatory Visit: Payer: Medicaid Other | Admitting: Clinical

## 2021-09-25 DIAGNOSIS — Z91199 Patient's noncompliance with other medical treatment and regimen due to unspecified reason: Secondary | ICD-10-CM

## 2021-10-06 NOTE — Progress Notes (Signed)
Chart reviewed for nurse visit. Agree with plan of care.   Marny Lowenstein, PA-C 10/06/2021 11:34 AM

## 2022-06-07 ENCOUNTER — Ambulatory Visit: Payer: Medicaid Other | Admitting: Clinical

## 2022-06-07 DIAGNOSIS — Z91199 Patient's noncompliance with other medical treatment and regimen due to unspecified reason: Secondary | ICD-10-CM

## 2022-06-07 NOTE — BH Specialist Note (Signed)
Pt did not arrive to video visit and did not answer the phone; Left HIPPA-compliant message to call back Leodis Alcocer from [facility] at  [phone] (Sammie Denner's office).  ?; left MyChart message for patient.  ? ?

## 2022-09-17 ENCOUNTER — Ambulatory Visit: Payer: Medicaid Other | Admitting: Obstetrics and Gynecology

## 2022-09-17 ENCOUNTER — Other Ambulatory Visit: Payer: Self-pay

## 2023-01-09 ENCOUNTER — Ambulatory Visit (INDEPENDENT_AMBULATORY_CARE_PROVIDER_SITE_OTHER): Payer: Medicaid Other | Admitting: Obstetrics & Gynecology

## 2023-01-09 ENCOUNTER — Other Ambulatory Visit (HOSPITAL_COMMUNITY)
Admission: RE | Admit: 2023-01-09 | Discharge: 2023-01-09 | Disposition: A | Discharge status: Home / Self Care | Payer: Medicaid Other | Source: Ambulatory Visit | Attending: Obstetrics & Gynecology | Admitting: Obstetrics & Gynecology

## 2023-01-09 ENCOUNTER — Encounter: Payer: Self-pay | Admitting: Obstetrics & Gynecology

## 2023-01-09 ENCOUNTER — Other Ambulatory Visit: Payer: Self-pay

## 2023-01-09 VITALS — BP 113/73 | HR 76 | Wt 128.5 lb

## 2023-01-09 DIAGNOSIS — Z113 Encounter for screening for infections with a predominantly sexual mode of transmission: Secondary | ICD-10-CM

## 2023-01-09 DIAGNOSIS — Z01419 Encounter for gynecological examination (general) (routine) without abnormal findings: Secondary | ICD-10-CM | POA: Insufficient documentation

## 2023-01-09 NOTE — Progress Notes (Signed)
GYNECOLOGY ANNUAL PREVENTATIVE CARE ENCOUNTER NOTE  History:     Laura Moyer is a 27 y.o. G51P0010 female here for a routine annual gynecologic exam.  Current complaints: none.  Desires pap and annual STI screen.   Denies abnormal vaginal bleeding, discharge, pelvic pain, problems with intercourse or other gynecologic concerns.    Gynecologic History No LMP recorded. Contraception: none, declines counseling Last Pap: 08/07/2021. Result was normal   Obstetric History OB History  Gravida Para Term Preterm AB Living  2 0 0 0 1 0  SAB IAB Ectopic Multiple Live Births  1 0 0 0 1    # Outcome Date GA Lbr Len/2nd Weight Sex Delivery Anes PTL Lv  2 SAB 2018          1 Gravida     F CS-LTranv       Past Medical History:  Diagnosis Date   Postpartum depression 08/07/2021    Past Surgical History:  Procedure Laterality Date   CESAREAN SECTION N/A 04/18/2021   Procedure: CESAREAN SECTION;  Surgeon: Milas Hock, MD;  Location: [facility] LD ORS;  Service: Obstetrics;  Laterality: N/A;    No current outpatient medications on file prior to visit.   No current facility-administered medications on file prior to visit.    No Known Allergies  Social History:  reports that she has never smoked. She has never used smokeless tobacco. She reports that she does not currently use alcohol. She reports that she does not currently use drugs after having used the following drugs: Marijuana.  Family History  Problem Relation Age of Onset   Diabetes Maternal Grandmother    Diabetes Mother    Heart disease Mother     The following portions of the patient's history were reviewed and updated as appropriate: allergies, current medications, past family history, past medical history, past social history, past surgical history and problem list.  Review of Systems Pertinent items noted in HPI and remainder of comprehensive ROS otherwise negative.  Physical Exam:  BP 113/73   Pulse 76   Wt 128 lb  8 oz (58.3 kg)   BMI 24.28 kg/m  CONSTITUTIONAL: Well-developed, well-nourished female in no acute distress.  HENT:  Normocephalic, atraumatic, External right and left ear normal.  EYES: Conjunctivae and EOM are normal. Pupils are equal, round, and reactive to light. No scleral icterus.  NECK: Normal range of motion, supple, no masses.  Normal thyroid.  SKIN: Skin is warm and dry. No rash noted. Not diaphoretic. No erythema. No pallor. MUSCULOSKELETAL: Normal range of motion. No tenderness.  No cyanosis, clubbing, or edema. NEUROLOGIC: Alert and oriented to person, place, and time. Normal reflexes, muscle tone coordination.  PSYCHIATRIC: Normal mood and affect. Normal behavior. Normal judgment and thought content. CARDIOVASCULAR: Normal heart rate noted, regular rhythm RESPIRATORY: Clear to auscultation bilaterally. Effort and breath sounds normal, no problems with respiration noted. BREASTS: Symmetric in size. No masses, tenderness, skin changes, nipple drainage, or lymphadenopathy bilaterally. Performed in the presence of a chaperone. ABDOMEN: Soft, no distention noted.  No tenderness, rebound or guarding.  PELVIC: Normal appearing external genitalia and urethral meatus; normal appearing vaginal mucosa and cervix.  No abnormal vaginal discharge noted.  Pap smear obtained.  Normal uterine size, no other palpable masses, no uterine or adnexal tenderness.  Performed in the presence of a chaperone.   Assessment and Plan:     1. Routine screening for STI (sexually transmitted infection) STI screen done, will follow up results and manage  accordingly. - RPR+HBsAg+HCVAb+HIV - Cytology - PAP  2. Well woman exam with routine gynecological exam - Cytology - PAP Will follow up results of pap smear and manage accordingly. Routine preventative health maintenance measures emphasized. Please refer to After Visit Summary for other counseling recommendations.      Jaynie Collins, MD,  FACOG Obstetrician & Gynecologist, [facility] for [employer], [facility]

## 2023-01-10 LAB — RPR+HBSAG+HCVAB+...
HIV Screen 4th Generation wRfx: NONREACTIVE
Hep C Virus Ab: NONREACTIVE
Hepatitis B Surface Ag: NEGATIVE
RPR Ser Ql: NONREACTIVE

## 2023-01-18 LAB — CYTOLOGY - PAP
Chlamydia: NEGATIVE
Comment: NEGATIVE
Comment: NEGATIVE
Comment: NEGATIVE
Comment: NORMAL
Diagnosis: NEGATIVE
High risk HPV: NEGATIVE
Neisseria Gonorrhea: NEGATIVE
Trichomonas: NEGATIVE

## 2023-12-11 DIAGNOSIS — G5700 Lesion of sciatic nerve, unspecified lower limb: Secondary | ICD-10-CM | POA: Diagnosis not present

## 2024-01-09 ENCOUNTER — Encounter: Payer: Self-pay | Admitting: Obstetrics & Gynecology

## 2024-01-10 ENCOUNTER — Ambulatory Visit: Admitting: Obstetrics & Gynecology

## 2024-01-13 ENCOUNTER — Ambulatory Visit: Admitting: Obstetrics & Gynecology

## 2024-04-17 ENCOUNTER — Ambulatory Visit: Admitting: Obstetrics & Gynecology

## 2024-08-10 DIAGNOSIS — M545 Low back pain, unspecified: Secondary | ICD-10-CM | POA: Diagnosis not present

## 2024-08-10 DIAGNOSIS — M25551 Pain in right hip: Secondary | ICD-10-CM | POA: Diagnosis not present

## 2024-08-10 DIAGNOSIS — R030 Elevated blood-pressure reading, without diagnosis of hypertension: Secondary | ICD-10-CM | POA: Diagnosis not present

## 2024-08-10 DIAGNOSIS — M25552 Pain in left hip: Secondary | ICD-10-CM | POA: Diagnosis not present
